# Patient Record
Sex: Male | Born: 1970 | Race: Black or African American | Hispanic: No | Smoking: Never smoker
Health system: Southern US, Community
[De-identification: ages and names within clinical notes are randomized; demographics above are authoritative.]

## PROBLEM LIST (undated history)

## (undated) DIAGNOSIS — I209 Angina pectoris, unspecified: Secondary | ICD-10-CM

## (undated) DIAGNOSIS — J45909 Unspecified asthma, uncomplicated: Secondary | ICD-10-CM

---

## 2001-06-25 ENCOUNTER — Emergency Department (HOSPITAL_COMMUNITY): Admission: EM | Admit: 2001-06-25 | Discharge: 2001-06-25 | Payer: Self-pay | Admitting: Emergency Medicine

## 2002-03-13 ENCOUNTER — Emergency Department (HOSPITAL_COMMUNITY): Admission: EM | Admit: 2002-03-13 | Discharge: 2002-03-14 | Payer: Self-pay | Admitting: Emergency Medicine

## 2002-06-27 ENCOUNTER — Encounter: Payer: Self-pay | Admitting: Emergency Medicine

## 2002-06-27 ENCOUNTER — Emergency Department (HOSPITAL_COMMUNITY): Admission: EM | Admit: 2002-06-27 | Discharge: 2002-06-27 | Payer: Self-pay | Admitting: Emergency Medicine

## 2002-09-27 ENCOUNTER — Emergency Department (HOSPITAL_COMMUNITY): Admission: EM | Admit: 2002-09-27 | Discharge: 2002-09-27 | Payer: Self-pay | Admitting: Emergency Medicine

## 2003-05-21 ENCOUNTER — Emergency Department (HOSPITAL_COMMUNITY): Admission: EM | Admit: 2003-05-21 | Discharge: 2003-05-21 | Payer: Self-pay | Admitting: Emergency Medicine

## 2003-05-27 ENCOUNTER — Emergency Department (HOSPITAL_COMMUNITY): Admission: EM | Admit: 2003-05-27 | Discharge: 2003-05-27 | Payer: Self-pay | Admitting: Family Medicine

## 2007-11-01 ENCOUNTER — Emergency Department (HOSPITAL_COMMUNITY): Admission: EM | Admit: 2007-11-01 | Discharge: 2007-11-01 | Payer: Self-pay | Admitting: Emergency Medicine

## 2007-11-18 ENCOUNTER — Emergency Department (HOSPITAL_COMMUNITY): Admission: EM | Admit: 2007-11-18 | Discharge: 2007-11-18 | Payer: Self-pay | Admitting: Emergency Medicine

## 2008-03-15 ENCOUNTER — Emergency Department (HOSPITAL_COMMUNITY): Admission: EM | Admit: 2008-03-15 | Discharge: 2008-03-15 | Payer: Self-pay | Admitting: Emergency Medicine

## 2008-07-05 ENCOUNTER — Emergency Department (HOSPITAL_COMMUNITY): Admission: EM | Admit: 2008-07-05 | Discharge: 2008-07-05 | Payer: Self-pay | Admitting: Emergency Medicine

## 2008-09-13 ENCOUNTER — Emergency Department (HOSPITAL_COMMUNITY): Admission: EM | Admit: 2008-09-13 | Discharge: 2008-09-13 | Payer: Self-pay | Admitting: Emergency Medicine

## 2009-01-17 ENCOUNTER — Emergency Department (HOSPITAL_COMMUNITY): Admission: EM | Admit: 2009-01-17 | Discharge: 2009-01-17 | Payer: Self-pay | Admitting: Emergency Medicine

## 2009-01-23 ENCOUNTER — Emergency Department (HOSPITAL_COMMUNITY): Admission: EM | Admit: 2009-01-23 | Discharge: 2009-01-23 | Payer: Self-pay | Admitting: Emergency Medicine

## 2009-02-28 ENCOUNTER — Emergency Department (HOSPITAL_COMMUNITY): Admission: EM | Admit: 2009-02-28 | Discharge: 2009-02-28 | Payer: Self-pay | Admitting: Family Medicine

## 2009-06-30 ENCOUNTER — Emergency Department (HOSPITAL_COMMUNITY): Admission: EM | Admit: 2009-06-30 | Discharge: 2009-06-30 | Payer: Self-pay | Admitting: Emergency Medicine

## 2009-07-17 ENCOUNTER — Emergency Department (HOSPITAL_COMMUNITY): Admission: EM | Admit: 2009-07-17 | Discharge: 2009-07-17 | Payer: Self-pay | Admitting: Emergency Medicine

## 2009-12-17 ENCOUNTER — Emergency Department (HOSPITAL_COMMUNITY): Admission: EM | Admit: 2009-12-17 | Discharge: 2009-12-17 | Payer: Self-pay | Admitting: Emergency Medicine

## 2010-05-11 LAB — CBC
MCH: 31.7 pg (ref 26.0–34.0)
MCHC: 35.9 g/dL (ref 30.0–36.0)
Platelets: 241 10*3/uL (ref 150–400)
RBC: 4.86 MIL/uL (ref 4.22–5.81)

## 2010-05-11 LAB — DIFFERENTIAL
Eosinophils Absolute: 0.1 10*3/uL (ref 0.0–0.7)
Eosinophils Relative: 1 % (ref 0–5)
Lymphocytes Relative: 45 % (ref 12–46)
Lymphs Abs: 2.2 10*3/uL (ref 0.7–4.0)
Monocytes Absolute: 0.5 10*3/uL (ref 0.1–1.0)
Monocytes Relative: 10 % (ref 3–12)

## 2010-05-11 LAB — COMPREHENSIVE METABOLIC PANEL
ALT: 13 U/L (ref 0–53)
AST: 21 U/L (ref 0–37)
Albumin: 4.3 g/dL (ref 3.5–5.2)
Calcium: 9.7 mg/dL (ref 8.4–10.5)
Creatinine, Ser: 1.16 mg/dL (ref 0.4–1.5)
GFR calc Af Amer: >60 mL/min (ref >60)
Sodium: 140 mEq/L (ref 135–145)

## 2010-05-17 LAB — POCT I-STAT, CHEM 8
BUN: 9 mg/dL (ref 6–23)
Calcium, Ion: 1.15 mmol/L (ref 1.12–1.32)
Chloride: 103 mEq/L (ref 96–112)
Creatinine, Ser: 1 mg/dL (ref 0.4–1.5)
Glucose, Bld: 84 mg/dL (ref 70–99)

## 2010-06-07 LAB — URINALYSIS, ROUTINE W REFLEX MICROSCOPIC
Bilirubin Urine: NEGATIVE
Glucose, UA: NEGATIVE mg/dL
Hgb urine dipstick: NEGATIVE
Ketones, ur: NEGATIVE mg/dL
pH: 7 (ref 5.0–8.0)

## 2010-08-07 ENCOUNTER — Emergency Department (HOSPITAL_COMMUNITY)
Admission: EM | Admit: 2010-08-07 | Discharge: 2010-08-07 | Disposition: A | Discharge status: Home / Self Care | Payer: Self-pay | Attending: Emergency Medicine | Admitting: Emergency Medicine

## 2010-08-07 DIAGNOSIS — J45909 Unspecified asthma, uncomplicated: Secondary | ICD-10-CM | POA: Insufficient documentation

## 2010-08-07 DIAGNOSIS — R350 Frequency of micturition: Secondary | ICD-10-CM | POA: Insufficient documentation

## 2010-08-07 DIAGNOSIS — R51 Headache: Secondary | ICD-10-CM | POA: Insufficient documentation

## 2010-08-07 LAB — GLUCOSE, CAPILLARY: Glucose-Capillary: 98 mg/dL (ref 70–99)

## 2010-08-07 LAB — URINALYSIS, ROUTINE W REFLEX MICROSCOPIC
Glucose, UA: NEGATIVE mg/dL
Leukocytes, UA: NEGATIVE
Protein, ur: NEGATIVE mg/dL
Specific Gravity, Urine: 1.021 (ref 1.005–1.030)

## 2010-09-03 ENCOUNTER — Emergency Department (HOSPITAL_COMMUNITY)
Admission: EM | Admit: 2010-09-03 | Discharge: 2010-09-03 | Disposition: A | Discharge status: Home / Self Care | Payer: Self-pay | Attending: Emergency Medicine | Admitting: Emergency Medicine

## 2010-09-03 ENCOUNTER — Emergency Department (HOSPITAL_COMMUNITY): Payer: Self-pay

## 2010-09-03 DIAGNOSIS — K219 Gastro-esophageal reflux disease without esophagitis: Secondary | ICD-10-CM | POA: Insufficient documentation

## 2010-09-03 DIAGNOSIS — R05 Cough: Secondary | ICD-10-CM | POA: Insufficient documentation

## 2010-09-03 DIAGNOSIS — R059 Cough, unspecified: Secondary | ICD-10-CM | POA: Insufficient documentation

## 2010-09-03 LAB — D-DIMER, QUANTITATIVE: D-Dimer, Quant: 0.24 ug/mL-FEU (ref 0.00–0.48)

## 2010-09-03 LAB — DIFFERENTIAL
Basophils Relative: 1 % (ref 0–1)
Eosinophils Absolute: 0.1 10*3/uL (ref 0.0–0.7)
Lymphs Abs: 3.2 10*3/uL (ref 0.7–4.0)
Neutrophils Relative %: 43 % (ref 43–77)

## 2010-09-03 LAB — POCT I-STAT, CHEM 8
Calcium, Ion: 1.26 mmol/L (ref 1.12–1.32)
Chloride: 102 mEq/L (ref 96–112)
HCT: 49 % (ref 39.0–52.0)
TCO2: 29 mmol/L (ref 0–100)

## 2010-09-03 LAB — CK TOTAL AND CKMB (NOT AT ARMC)
CK, MB: 1.4 ng/mL (ref 0.3–4.0)
Relative Index: 0.6 (ref 0.0–2.5)

## 2010-09-03 LAB — CBC
Platelets: 217 10*3/uL (ref 150–400)
RBC: 5.02 MIL/uL (ref 4.22–5.81)
WBC: 6.6 10*3/uL (ref 4.0–10.5)

## 2010-11-30 LAB — COMPREHENSIVE METABOLIC PANEL
ALT: 26
Albumin: 4.3
Alkaline Phosphatase: 47
BUN: 10
Chloride: 103
Potassium: 4.5
Sodium: 138
Total Bilirubin: 1

## 2010-11-30 LAB — URINE MICROSCOPIC-ADD ON

## 2010-11-30 LAB — CBC
HCT: 44
Hemoglobin: 15.2
Platelets: 203
WBC: 4.4

## 2010-11-30 LAB — DIFFERENTIAL
Basophils Absolute: 0
Basophils Relative: 0
Eosinophils Absolute: 0
Eosinophils Relative: 0
Monocytes Absolute: 0.3
Neutro Abs: 3.1

## 2010-11-30 LAB — POCT CARDIAC MARKERS: CKMB, poc: 1

## 2010-11-30 LAB — URINALYSIS, ROUTINE W REFLEX MICROSCOPIC
Bilirubin Urine: NEGATIVE
Protein, ur: NEGATIVE
Urobilinogen, UA: 1

## 2011-02-23 ENCOUNTER — Ambulatory Visit (INDEPENDENT_AMBULATORY_CARE_PROVIDER_SITE_OTHER): Payer: Self-pay | Admitting: Internal Medicine

## 2011-02-23 ENCOUNTER — Encounter: Payer: Self-pay | Admitting: Internal Medicine

## 2011-02-23 DIAGNOSIS — R079 Chest pain, unspecified: Secondary | ICD-10-CM | POA: Insufficient documentation

## 2011-02-23 NOTE — Progress Notes (Signed)
  Subjective:    Patient ID: Darren Mccoy, male    DOB: 1970-10-23, 40 y.o.   MRN: [id number]  HPI  82 yobm [place] in [place] since 2001 never smoker with chronic chest pain and sob self referred 02/23/2011  to pulmonary clinic.   02/23/2011 1st pulmonary eval cc daily cp x 2 years seems best first thing am positional worse on lying down L side with no assoc cough - has sense of sob but actually does better with activity than at rest.  Sleeping ok without nocturnal  or early am exacerbation  of respiratory  C/o's. Also denies any obvious fluctuation of symptoms with weather or environmental changes or other aggravating or alleviating factors except as outlined above   Review of Systems  Constitutional: Negative for fever and unexpected weight change.  HENT: Negative for ear pain, nosebleeds, congestion, sore throat, rhinorrhea, sneezing, trouble swallowing, dental problem, postnasal drip and sinus pressure.   Eyes: Negative for redness and itching.  Respiratory: Positive for chest tightness and shortness of breath. Negative for cough and wheezing.   Cardiovascular: Positive for chest pain. Negative for palpitations and leg swelling.  Gastrointestinal: Positive for nausea. Negative for vomiting.  Genitourinary: Negative for dysuria.  Musculoskeletal: Negative for joint swelling.  Skin: Negative for rash.  Neurological: Positive for headaches.  Hematological: Does not bruise/bleed easily.  Psychiatric/Behavioral: Negative for dysphoric mood. The patient is not nervous/anxious.        Objective:   Physical Exam amb wm nad Wt 148 02/23/2011 HEENT: nl dentition, turbinates, and orophanx. Nl external ear canals without cough reflex   NECK :  without JVD/Nodes/TM/ nl carotid upstrokes bilaterally   LUNGS: no acc muscle use, clear to A and P bilaterally without cough on insp or exp maneuvers   CV:  RRR  no s3 or murmur or increase in P2, no edema   ABD:  soft and nontender with nl  excursion in the supine position. No bruits or organomegaly, bowel sounds nl  MS:  warm without deformities, calf tenderness, cyanosis or clubbing  SKIN: warm and dry without lesions    NEURO:  alert, approp, no deficits    09/03/10 cxr No evidence of acute cardiopulmonary disease. There is significant air in splenic flex of colon        Assessment & Plan:

## 2011-02-23 NOTE — Patient Instructions (Addendum)
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 Frequently these patients have had multiple negative GI workups and CT scans.  Treatment consists of avoiding foods that cause gas (especially beans and raw vegetables like spinach and salads and boiled eggs)  and citrucel 1 heaping tsp twice daily with a large glass of water.  Pain should improve w/in 2 weeks and if not   Call  patient coordinator [phone])  To schedule ct of chest

## 2011-02-23 NOTE — Assessment & Plan Note (Signed)
Classic subdiaphragmatic pain pattern suggests ibs:  Stereotypical,  with a very limited distribution of pain locations, daytime, not exacerbated by ex (which he actually tolerates well) or coughing, worse in sitting position, associated with generalized abd bloating, not present supine due to the dome effect of the diaphragm is  canceled in that position. Frequently these patients have had multiple negative GI workups and CT scans.  Treatment consists of avoiding foods that cause gas (especially beans and raw vegetables like spinach and salads)  and citrucel 1 heaping tsp twice daily with a large glass of water.  Pain should improve w/in 2 weeks and if not then consider further w/u with ct chest

## 2011-04-19 ENCOUNTER — Encounter (HOSPITAL_COMMUNITY): Payer: Self-pay

## 2011-04-19 ENCOUNTER — Emergency Department (HOSPITAL_COMMUNITY)
Admission: EM | Admit: 2011-04-19 | Discharge: 2011-04-19 | Disposition: A | Discharge status: Home / Self Care | Payer: Self-pay | Attending: Emergency Medicine | Admitting: Emergency Medicine

## 2011-04-19 DIAGNOSIS — R51 Headache: Secondary | ICD-10-CM | POA: Insufficient documentation

## 2011-04-19 MED ORDER — METOCLOPRAMIDE HCL 5 MG/ML IJ SOLN
10.0000 mg | Freq: Once | INTRAMUSCULAR | Status: AC
  Administered 2011-04-19: 10 mg via INTRAMUSCULAR
  Filled 2011-04-19: qty 2

## 2011-04-19 MED ORDER — KETOROLAC TROMETHAMINE 30 MG/ML IJ SOLN
30.0000 mg | Freq: Once | INTRAMUSCULAR | Status: AC
  Administered 2011-04-19: 30 mg via INTRAMUSCULAR
  Filled 2011-04-19: qty 1

## 2011-04-19 MED ORDER — IBUPROFEN 600 MG PO TABS: 600.0000 mg | ORAL_TABLET | Freq: Four times a day (QID) | ORAL | Status: AC | PRN

## 2011-04-19 MED ORDER — ONDANSETRON HCL 4 MG PO TABS: 4.0000 mg | ORAL_TABLET | Freq: Four times a day (QID) | ORAL | Status: AC

## 2011-04-19 NOTE — ED Provider Notes (Signed)
History     CSN: [id number]  Arrival date & time 04/19/11  1219   First MD Initiated Contact with Patient 04/19/11 1430      Chief Complaint  Patient presents with  . Headache    (Consider location/radiation/quality/duration/timing/severity/associated sxs/prior treatment) HPI Patient presents with complaint of headache. He states that he has had intermittent headaches over the past several years. Headache is complaint as dull and frontal in nature. He states he has had a reassuring CT and MRI and workup for this headache. He was also seen by [facility]ic who advised that this may be a caffeine withdrawal-type of headache. He states that he has not had any relief from ibuprofen which he last took yesterday. He has no changes in his vision, no changes in speech, no weakness or numbness of his extremities. He denies fever or vomiting. There no other associated systemic symptoms. There are no alleviating or modifying factors.  History reviewed. No pertinent past medical history.  History reviewed. No pertinent past surgical history.  No family history on file.  History  Substance Use Topics  . Smoking status: Never Smoker   . Smokeless tobacco: Not on file  . Alcohol Use: No      Review of Systems ROS reviewed and otherwise negative except for mentioned in HPI  Allergies  Review of patient's allergies indicates no known allergies.  Home Medications   Current Outpatient Rx  Name Route Sig Dispense Refill  . IBUPROFEN 200 MG PO TABS Oral Take 400 mg by mouth at bedtime as needed. Pain.    . IBUPROFEN 600 MG PO TABS Oral Take 1 tablet (600 mg total) by mouth every 6 (six) hours as needed for pain. 30 tablet 0  . ONDANSETRON HCL 4 MG PO TABS Oral Take 1 tablet (4 mg total) by mouth every 6 (six) hours. 12 tablet 0    BP 108/78  Pulse 91  Temp(Src) 98.1 F (36.7 C) (Oral)  Resp 20  SpO2 100% Vitals reviewed Physical Exam Physical Examination: General appearance -  alert, well appearing, and in no distress Mental status - alert, oriented to person, place, and time Eyes - pupils equal and reactive, extraocular eye movements intact Mouth - mucous membranes moist, pharynx normal without lesions Chest - clear to auscultation, no wheezes, rales or rhonchi, symmetric air entry Heart - normal rate, regular rhythm, normal S1, S2, no murmurs, rubs, clicks or gallops Abdomen - soft, nontender, nondistended, no masses or organomegaly Neurological - alert, oriented, normal speech, cranial nerves 2-12 tested and intact, strength 5/5 in extremities x 4, sensation intact Musculoskeletal - no joint tenderness, deformity or swelling Extremities - peripheral pulses normal, no pedal edema, no clubbing or cyanosis Skin - normal coloration and turgor, no rashes  ED Course  Procedures (including critical care time)  Labs Reviewed - No data to display No results found.   1. Headache       MDM  Patient presenting with headache similar to his prior chronic headaches. He has a normal neurologic examination. He was given IM pain medications and I have discussed with him that he will need to arrange for repeat followup with neurology. He was discharged with strict return precautions and is agreeable with this plan.        Ethelda Chick, MD 04/21/11 [phone]

## 2011-04-19 NOTE — Discharge Instructions (Signed)
Return to the ED with any concerns including changes in your vision, changes in her speech, weakness of an arm or leg, vomiting, seizure activity, decreased level of alertness or lethargy, or any other alarming symptoms.

## 2011-04-19 NOTE — ED Notes (Signed)
Pt presents with headaches x 2 years.  Pt reports CT scan and MRI have been negative, reports pain begins to frontal area, and by the end of the day, his eyes are red.  Pt denies any vision change.  -photophobia or nausea.  Pt also reports L knee pain after falling yesterday.

## 2011-08-22 ENCOUNTER — Ambulatory Visit: Payer: Self-pay | Admitting: Family Medicine

## 2011-08-22 VITALS — BP 102/65 | HR 68 | Temp 98.6°F | Resp 16 | Ht 70.0 in | Wt 141.4 lb

## 2011-08-22 DIAGNOSIS — Z0289 Encounter for other administrative examinations: Secondary | ICD-10-CM

## 2011-08-22 NOTE — Progress Notes (Signed)
  Subjective:    Patient ID: Darren Mccoy, male    DOB: 10-Jun-1970, 41 y.o.   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  See forms,    Review of Systems  HENT: Negative for hearing loss.   Eyes: Negative for visual disturbance.  Neurological: Negative for dizziness, weakness and light-headedness.  also per form.     Objective:   Physical Exam  Constitutional: He is oriented to person, place, and time. He appears well-developed and well-nourished.  HENT:  Head: Normocephalic and atraumatic.  Eyes: EOM are normal. Pupils are equal, round, and reactive to light.  Neck: Normal range of motion. No JVD present. Carotid bruit is not present.  Cardiovascular: Normal rate, regular rhythm and normal heart sounds.   No murmur heard. Pulmonary/Chest: Effort normal and breath sounds normal. He has no rales.  Abdominal: Soft. Hernia confirmed negative in the right inguinal area and confirmed negative in the left inguinal area.  Musculoskeletal: Normal range of motion. He exhibits no edema and no tenderness.  Neurological: He is alert and oriented to person, place, and time.  Skin: Skin is warm and dry.  Psychiatric: He has a normal mood and affect.          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  Small blood on U/a - follow up with primary provider,  2 year card.

## 2011-08-27 ENCOUNTER — Emergency Department (HOSPITAL_COMMUNITY): Payer: Self-pay

## 2011-08-27 ENCOUNTER — Encounter (HOSPITAL_COMMUNITY): Payer: Self-pay | Admitting: Emergency Medicine

## 2011-08-27 ENCOUNTER — Observation Stay (HOSPITAL_COMMUNITY)
Admission: EM | Admit: 2011-08-27 | Discharge: 2011-08-27 | Disposition: A | Discharge status: Home / Self Care | Payer: Self-pay | Attending: Emergency Medicine | Admitting: Emergency Medicine

## 2011-08-27 ENCOUNTER — Observation Stay (HOSPITAL_COMMUNITY): Payer: Self-pay

## 2011-08-27 DIAGNOSIS — R0602 Shortness of breath: Secondary | ICD-10-CM | POA: Insufficient documentation

## 2011-08-27 DIAGNOSIS — J45909 Unspecified asthma, uncomplicated: Secondary | ICD-10-CM | POA: Insufficient documentation

## 2011-08-27 DIAGNOSIS — R079 Chest pain, unspecified: Principal | ICD-10-CM | POA: Insufficient documentation

## 2011-08-27 DIAGNOSIS — R300 Dysuria: Secondary | ICD-10-CM | POA: Insufficient documentation

## 2011-08-27 DIAGNOSIS — R350 Frequency of micturition: Secondary | ICD-10-CM | POA: Insufficient documentation

## 2011-08-27 HISTORY — DX: Unspecified asthma, uncomplicated: J45.909

## 2011-08-27 LAB — POCT I-STAT, CHEM 8
BUN: 11 mg/dL (ref 6–23)
Calcium, Ion: 1.24 mmol/L (ref 1.12–1.32)
Chloride: 101 mEq/L (ref 96–112)
Creatinine, Ser: 1.1 mg/dL (ref 0.50–1.35)
Glucose, Bld: 85 mg/dL (ref 70–99)
TCO2: 27 mmol/L (ref 0–100)

## 2011-08-27 LAB — CBC
HCT: 45.3 % (ref 39.0–52.0)
Hemoglobin: 16.1 g/dL (ref 13.0–17.0)
MCH: 31 pg (ref 26.0–34.0)
MCHC: 35.5 g/dL (ref 30.0–36.0)
MCV: 87.3 fL (ref 78.0–100.0)
RBC: 5.19 MIL/uL (ref 4.22–5.81)

## 2011-08-27 LAB — URINALYSIS, ROUTINE W REFLEX MICROSCOPIC
Bilirubin Urine: NEGATIVE
Glucose, UA: NEGATIVE mg/dL
Hgb urine dipstick: NEGATIVE
Ketones, ur: NEGATIVE mg/dL
Leukocytes, UA: NEGATIVE
Protein, ur: NEGATIVE mg/dL
pH: 6.5 (ref 5.0–8.0)

## 2011-08-27 LAB — POCT I-STAT TROPONIN I: Troponin i, poc: 0 ng/mL (ref 0.00–0.08)

## 2011-08-27 MED ORDER — ASPIRIN 81 MG PO CHEW
324.0000 mg | CHEWABLE_TABLET | Freq: Once | ORAL | Status: AC
  Administered 2011-08-27: 324 mg via ORAL
  Filled 2011-08-27: qty 4

## 2011-08-27 MED ORDER — METOPROLOL TARTRATE 25 MG PO TABS
100.0000 mg | ORAL_TABLET | Freq: Once | ORAL | Status: AC
  Administered 2011-08-27: 100 mg via ORAL

## 2011-08-27 MED ORDER — METOPROLOL TARTRATE 25 MG PO TABS
25.0000 mg | ORAL_TABLET | Freq: Once | ORAL | Status: DC
  Filled 2011-08-27: qty 4

## 2011-08-27 MED ORDER — OXYCODONE-ACETAMINOPHEN 5-325 MG PO TABS
1.0000 | ORAL_TABLET | Freq: Once | ORAL | Status: AC
  Administered 2011-08-27: 1 via ORAL
  Filled 2011-08-27: qty 1

## 2011-08-27 MED ORDER — METOPROLOL TARTRATE 25 MG PO TABS: 50.0000 mg | ORAL_TABLET | Freq: Once | ORAL | Status: DC

## 2011-08-27 MED ORDER — NITROGLYCERIN 0.4 MG SL SUBL
0.4000 mg | SUBLINGUAL_TABLET | Freq: Once | SUBLINGUAL | Status: AC
  Administered 2011-08-27: 0.4 mg via SUBLINGUAL
  Filled 2011-08-27: qty 25

## 2011-08-27 MED ORDER — METOPROLOL TARTRATE 1 MG/ML IV SOLN
5.0000 mg | Freq: Once | INTRAVENOUS | Status: DC
  Filled 2011-08-27: qty 10

## 2011-08-27 MED ORDER — METOPROLOL TARTRATE 25 MG PO TABS
50.0000 mg | ORAL_TABLET | Freq: Once | ORAL | Status: AC
  Administered 2011-08-27: 50 mg via ORAL
  Filled 2011-08-27: qty 2

## 2011-08-27 MED ORDER — IOHEXOL 350 MG/ML SOLN
80.0000 mL | Freq: Once | INTRAVENOUS | Status: AC | PRN
  Administered 2011-08-27: 80 mL via INTRAVENOUS

## 2011-08-27 NOTE — ED Notes (Signed)
Pt returned from x-ray, also aware of the need for UA sample.

## 2011-08-27 NOTE — ED Notes (Signed)
Pt placed on 5 lead cardiac monitor, cont. Pulse ox and cont. bp.

## 2011-08-27 NOTE — Discharge Instructions (Signed)
As we discussed, the CT scan today shows your pain is not caused by heart diease. The CT scan showed you may have something called a Patent Foramen Ovale, for which you can have further testing by a cardiologist. The number to contact them is listed above.    Chest Pain (Nonspecific) It is often hard to give a specific diagnosis for the cause of chest pain. There is always a chance that your pain could be related to something serious, such as a heart attack or a blood clot in the lungs. You need to follow up with your caregiver for further evaluation. CAUSES   Heartburn.   Pneumonia or bronchitis.   Anxiety or stress.   Inflammation around your heart (pericarditis) or lung (pleuritis or pleurisy).   A blood clot in the lung.   A collapsed lung (pneumothorax). It can develop suddenly on its own (spontaneous pneumothorax) or from injury (trauma) to the chest.   Shingles infection (herpes zoster virus).  The chest wall is composed of bones, muscles, and cartilage. Any of these can be the source of the pain.  The bones can be bruised by injury.   The muscles or cartilage can be strained by coughing or overwork.   The cartilage can be affected by inflammation and become sore (costochondritis).  DIAGNOSIS  Lab tests or other studies, such as X-rays, electrocardiography, stress testing, or cardiac imaging, may be needed to find the cause of your pain.  TREATMENT   Treatment depends on what may be causing your chest pain. Treatment may include:   Acid blockers for heartburn.   Anti-inflammatory medicine.   Pain medicine for inflammatory conditions.   Antibiotics if an infection is present.   You may be advised to change lifestyle habits. This includes stopping smoking and avoiding alcohol, caffeine, and chocolate.   You may be advised to keep your head raised (elevated) when sleeping. This reduces the chance of acid going backward from your stomach into your esophagus.   Most of  the time, nonspecific chest pain will improve within 2 to 3 days with rest and mild pain medicine.  HOME CARE INSTRUCTIONS   If antibiotics were prescribed, take your antibiotics as directed. Finish them even if you start to feel better.   For the next few days, avoid physical activities that bring on chest pain. Continue physical activities as directed.   Do not smoke.   Avoid drinking alcohol.   Only take over-the-counter or prescription medicine for pain, discomfort, or fever as directed by your caregiver.   Follow your caregiver's suggestions for further testing if your chest pain does not go away.   Keep any follow-up appointments you made. If you do not go to an appointment, you could develop lasting (chronic) problems with pain. If there is any problem keeping an appointment, you must call to reschedule.  SEEK MEDICAL CARE IF:   You think you are having problems from the medicine you are taking. Read your medicine instructions carefully.   Your chest pain does not go away, even after treatment.   You develop a rash with blisters on your chest.  SEEK IMMEDIATE MEDICAL CARE IF:   You have increased chest pain or pain that spreads to your arm, neck, jaw, back, or abdomen.   You develop shortness of breath, an increasing cough, or you are coughing up blood.   You have severe back or abdominal pain, feel nauseous, or vomit.   You develop severe weakness, fainting, or chills.  You have a fever.  THIS IS AN EMERGENCY. Do not wait to see if the pain will go away. Get medical help at once. Call your local emergency services (911 in [place] Do not drive yourself to the hospital. MAKE SURE YOU:   Understand these instructions.   Will watch your condition.   Will get help right away if you are not doing well or get worse.  Document Released: 11/23/2004 Document Revised: 02/02/2011 Document Reviewed: 09/19/2007 [facility] Patient Information 2012 [place], [place].

## 2011-08-27 NOTE — ED Notes (Signed)
Pt reports left sided chest pain intermittently x 1 week. Pt reports shortness of breath with pain and that his eyes and palms of hands get red.

## 2011-08-27 NOTE — ED Provider Notes (Signed)
12:09 PM Pt is in the CDU on chest pain protocol. Initial care by PA Paz. Fairly healthy 41 y/o [place] male with only coronary disease in his mother late in life. Intermittent pain for the last 2 years with EKGs but no advanced testing performed in the past. Pain free at this time.  On exam, he is alert and oriented, NAD. Lungs CTAB. Heart RRR. Bilateral radial and DP pulses are 2+.  Plan is for coronary CT as soon as his heart rate is within acceptable limits.    3:58 PM Coronary CT results are discussed with radiologist. A shunt has no evidence of significant coronary disease with a calcium score of 0. Question the presence of a patent foramen ovale. This is discussed with the patient and he is given a handout with information on this possible diagnosis. I discussed with him followup with a cardiologist for further testing it was his understanding of the plan. He will be discharged home at this time.  Shaaron Adler, [place] 08/27/11 1559

## 2011-08-27 NOTE — ED Provider Notes (Signed)
And in complaining of intermittent chest pain for the last one week. He states he's had these episodes intermittently that occur every 3-4 months for the last 2 years. They are no associated symptoms other than some mild shortness of breath with these. He denies any nausea vomiting or diaphoresis. Patient has no cardiac history and takes no pain medications or cardiac medications. He is a [employer] but denies any leg pain or swelling.  Gen: NAD HEENT:  Normal, atraumatic, moist mucous membranes Resp: CTAB, no chest wall pain CV: RRR, no MRG.  Normal peripheral pulses Abd: nontender and no masses Skin: no erythema or rashes  Pt will be placed in CDU cardiac obs for low risk chest pain and undergo cardiac CT  Gwyneth Sprout, MD 08/27/11 1538

## 2011-08-27 NOTE — ED Provider Notes (Signed)
History     CSN: [id number]  Arrival date & time 08/27/11  5621   First MD Initiated Contact with Patient 08/27/11 [phone]      Chief Complaint  Patient presents with  . Chest Pain    (Consider location/radiation/quality/duration/timing/severity/associated sxs/prior treatment) HPI Comments: Patient with a history of asthma presents emergency department with chief complaint of intermittent chest pain.  Patient reports that he's been having this chest pain for about 2 years and denies ever having a cardiac evaluation.  The chest pain occurs every 2-3 months and lasts approximately 2 weeks coming and going throughout the day.  Patient states that when the chest pain hits it can last up to 6-7 hours and is described as a left-sided pressure is associated with shortness of breath and relieves on its own.  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  Patient denies fever, night sweats, chills, cough, hemoptysis, orthopnea, PND, dyspnea on exertion, leg swelling, palpitations, lightheadedness, abdominal pain.  In addition patient states that he has had dysuria and urinary frequency times one week.  Patient had a physical exam last week that was positive for hematuria.  Patient denies abnormal discharge or back pain.  Patient did not have a history of diabetes, hyperlipidemia, hypertension, has never smoked cigarettes, and did not have a family history of cardiac disease.  Patient is a 41 y.o. male presenting with chest pain. The history is provided by the patient.  Chest Pain Primary symptoms include shortness of breath. Pertinent negatives for primary symptoms include no fever, no cough, no wheezing, no palpitations, no abdominal pain and no dizziness.  Pertinent negatives for associated symptoms include no numbness and no weakness.     Past Medical History  Diagnosis Date  . Asthma     History reviewed. No pertinent past surgical  history.  History reviewed. No pertinent family history.  History  Substance Use Topics  . Smoking status: Never Smoker   . Smokeless tobacco: Not on file  . Alcohol Use: No      Review of Systems  Constitutional: Negative for fever, chills and appetite change.  HENT: Negative for congestion.   Eyes: Negative for visual disturbance.  Respiratory: Positive for shortness of breath. Negative for apnea, cough, choking, chest tightness, wheezing and stridor.   Cardiovascular: Positive for chest pain. Negative for palpitations and leg swelling.  Gastrointestinal: Negative for abdominal pain.  Genitourinary: Positive for dysuria and frequency. Negative for urgency.  Neurological: Negative for dizziness, syncope, weakness, light-headedness, numbness and headaches.  Psychiatric/Behavioral: Negative for confusion.  All other systems reviewed and are negative.    Allergies  Review of patient's allergies indicates no known allergies.  Home Medications   Current Outpatient Rx  Name Route Sig Dispense Refill  . DIPHENHYDRAMINE-APAP (SLEEP) 50-1000 MG/30ML PO LIQD Oral Take 5 mLs by mouth at bedtime as needed. For pain/sleep      BP 124/82  Pulse 64  Temp 97.8 F (36.6 C) (Oral)  Resp 16  SpO2 100%  Physical Exam  Nursing note and vitals reviewed. Constitutional: He appears well-developed and well-nourished. No distress.  HENT:  Head: Normocephalic and atraumatic.  Eyes: Conjunctivae and EOM are normal. Pupils are equal, round, and reactive to light.  Neck: Normal range of motion. Neck supple. Normal carotid pulses and no JVD present. Carotid bruit is not present. No rigidity. Normal range of motion present.  Cardiovascular: Normal rate, regular rhythm, S1 normal, S2  normal, normal heart sounds, intact distal pulses and normal pulses.  Exam reveals no gallop and no friction rub.   No murmur heard.      No pitting edema bilaterally, RRR, no aberrant sounds on auscultations,  distal pulses intact, no carotid bruit or JVD.   Pulmonary/Chest: Effort normal. No accessory muscle usage or stridor. He exhibits no tenderness and no bony tenderness.       LCAB, no resp distress  Abdominal: Bowel sounds are normal.       Soft non tender. Non pulsatile aorta.   Skin: Skin is warm, dry and intact. No rash noted. He is not diaphoretic. No cyanosis. Nails show no clubbing.    ED Course  Procedures (including critical care time)   Labs Reviewed  CBC  POCT I-STAT, CHEM 8  POCT I-STAT TROPONIN I  URINALYSIS, ROUTINE W REFLEX MICROSCOPIC   Dg Chest 2 View  08/27/2011  *RADIOLOGY REPORT*  Clinical Data: Chest pain  CHEST - 2 VIEW  Comparison: 09/03/2010  Findings: Heart size is normal.  No pleural effusion or edema.  No airspace consolidation.  Review of the visualized osseous structures is unremarkable.  IMPRESSION:  1.  No acute cardiopulmonary abnormalities  Original Report Authenticated By: Rosealee Albee, M.D.     No diagnosis found.   Date: 08/27/2011  Rate: 66  Rhythm: normal sinus rhythm  QRS Axis: normal  Intervals: normal  ST/T Wave abnormalities: Nonspecific ST and T wave changes  Conduction Disutrbances: none  Narrative Interpretation: Possible left atrial enlargement and LVH  Old EKG Reviewed: No significant changes noted  Dr. Olevia Bowens requests 100 mg metoprolol ASAP. Re-confirmed pts HR is only 64 and he again requests this dose. Dose ordered. Discussed with Lum Babe in CDU who agrees to observe pt for up to 4 hrs to assure his HR normalizes prior to dc.   MDM  Chest pain  Patient moved to CDU under chest pain protocol. Patient resting comfortably at present without return of chest pain. Lungs CTA bilaterally. S1/S2, RRR, no murmur. Abdomen soft, bowel sounds present. Strong distal pulses palpated all extremities. Sinus rhythm on monitor without ectopy. Troponin negative x 1, 12 lead reviewed, no indication of ischemia. Patient scheduled for  coronary CT today.  Diagnostic and treatment plan discussed with patient. Dispo pending results          Jaci Carrel, PA-C 08/27/11 1117

## 2011-08-28 NOTE — ED Provider Notes (Signed)
Medical screening examination/treatment/procedure(s) were conducted as a shared visit with non-physician practitioner(s) and myself.  I personally evaluated the patient during the encounter   Darren Sprout, MD 08/28/11 1636

## 2011-08-28 NOTE — ED Provider Notes (Signed)
Medical screening examination/treatment/procedure(s) were conducted as a shared visit with non-physician practitioner(s) and myself.  I personally evaluated the patient during the encounter   Gwyneth Sprout, MD 08/28/11 1637

## 2011-09-26 ENCOUNTER — Encounter: Payer: Self-pay | Admitting: *Deleted

## 2011-09-26 ENCOUNTER — Telehealth: Payer: Self-pay | Admitting: Internal Medicine

## 2011-09-26 NOTE — Telephone Encounter (Signed)
ATC patient at number provided.  No answer, lmtcb.

## 2011-09-27 NOTE — Telephone Encounter (Signed)
[email or web address]  

## 2011-09-28 ENCOUNTER — Ambulatory Visit: Payer: Self-pay | Admitting: Cardiology

## 2011-09-28 NOTE — Telephone Encounter (Signed)
[id number] on home and cell number. Naethan Bracewell, CMA  

## 2011-09-29 NOTE — Telephone Encounter (Signed)
Results?  Pt hasn't been seen by MW since 01/2011.  Per triage protocol, will close message and await pt to call back.  Left this on the voice mail.

## 2011-10-26 ENCOUNTER — Ambulatory Visit: Payer: Self-pay | Admitting: Cardiology

## 2011-12-16 ENCOUNTER — Emergency Department (HOSPITAL_COMMUNITY): Payer: Self-pay

## 2011-12-16 ENCOUNTER — Encounter (HOSPITAL_COMMUNITY): Payer: Self-pay | Admitting: Emergency Medicine

## 2011-12-16 ENCOUNTER — Other Ambulatory Visit: Payer: Self-pay

## 2011-12-16 ENCOUNTER — Emergency Department (HOSPITAL_COMMUNITY)
Admission: EM | Admit: 2011-12-16 | Discharge: 2011-12-16 | Disposition: A | Discharge status: Home / Self Care | Payer: Self-pay | Attending: Emergency Medicine | Admitting: Emergency Medicine

## 2011-12-16 DIAGNOSIS — J45909 Unspecified asthma, uncomplicated: Secondary | ICD-10-CM | POA: Insufficient documentation

## 2011-12-16 DIAGNOSIS — R079 Chest pain, unspecified: Secondary | ICD-10-CM

## 2011-12-16 LAB — CBC
HCT: 43.7 % (ref 39.0–52.0)
Hemoglobin: 15.6 g/dL (ref 13.0–17.0)
MCH: 31.6 pg (ref 26.0–34.0)
MCHC: 35.7 g/dL (ref 30.0–36.0)

## 2011-12-16 LAB — BASIC METABOLIC PANEL
BUN: 16 mg/dL (ref 6–23)
Calcium: 10 mg/dL (ref 8.4–10.5)
GFR calc non Af Amer: 80 mL/min — ABNORMAL LOW (ref >90)
Glucose, Bld: 96 mg/dL (ref 70–99)
Potassium: 4.5 mEq/L (ref 3.5–5.1)

## 2011-12-16 LAB — URINALYSIS, ROUTINE W REFLEX MICROSCOPIC
Bilirubin Urine: NEGATIVE
Glucose, UA: NEGATIVE mg/dL
Hgb urine dipstick: NEGATIVE
Specific Gravity, Urine: 1.009 (ref 1.005–1.030)
Urobilinogen, UA: 0.2 mg/dL (ref 0.0–1.0)

## 2011-12-16 LAB — POCT I-STAT TROPONIN I

## 2011-12-16 MED ORDER — IBUPROFEN 200 MG PO TABS
600.0000 mg | ORAL_TABLET | Freq: Once | ORAL | Status: AC
  Administered 2011-12-16: 600 mg via ORAL
  Filled 2011-12-16: qty 3

## 2011-12-16 NOTE — ED Notes (Addendum)
Pt c/o left sided CP with SOB x 1 week worse with inspiration; pt sts some pain with urination as well

## 2011-12-16 NOTE — ED Provider Notes (Addendum)
History     CSN: [id number]  Arrival date & time 12/16/11  4098   First MD Initiated Contact with Patient 12/16/11 1117      Chief Complaint  Patient presents with  . Chest Pain    (Consider location/radiation/quality/duration/timing/severity/associated sxs/prior treatment) Patient is a 41 y.o. male presenting with chest pain. The history is provided by the patient.  Chest Pain Pertinent negatives for primary symptoms include no fever, no shortness of breath, no cough, no palpitations, no abdominal pain and no vomiting.   pt c/o left lateral cp for past 2-3 days. Constant. Dull. No radiation. No associated nv, diaphoresis or sob. Pt denies any specific or consistent exacerbating or alleviating factors. No chest wall injury or strain. No personal or family hx cad. No pleuritic pain. No leg pain or swelling. No immobility, trauma or surgery. No cough or uri c/o. No fever or chills.     Past Medical History  Diagnosis Date  . Asthma   . Chest pain     Evaluated  in Pulmonary clinic/ [facility]/ [facility] / 02/23/2011      History reviewed. No pertinent past surgical history.  History reviewed. No pertinent family history.  History  Substance Use Topics  . Smoking status: Never Smoker   . Smokeless tobacco: Not on file  . Alcohol Use: No      Review of Systems  Constitutional: Negative for fever.  HENT: Negative for neck pain.   Eyes: Negative for redness.  Respiratory: Negative for cough and shortness of breath.   Cardiovascular: Positive for chest pain. Negative for palpitations and leg swelling.  Gastrointestinal: Negative for vomiting and abdominal pain.  Genitourinary: Negative for flank pain.  Musculoskeletal: Negative for back pain.  Skin: Negative for rash.  Neurological: Negative for headaches.  Hematological: Does not bruise/bleed easily.  Psychiatric/Behavioral: Negative for confusion.    Allergies  Review of patient's allergies indicates no known  allergies.  Home Medications   Current Outpatient Rx  Name Route Sig Dispense Refill  . DIPHENHYDRAMINE-APAP (SLEEP) 50-1000 MG/30ML PO LIQD Oral Take 5 mLs by mouth at bedtime as needed. For pain/sleep      BP 121/75  Pulse 70  Temp 97.6 F (36.4 C) (Oral)  Resp 20  SpO2 97%  Physical Exam  Nursing note and vitals reviewed. Constitutional: He is oriented to person, place, and time. He appears well-developed and well-nourished. No distress.  HENT:  Head: Atraumatic.  Eyes: Conjunctivae normal are normal.  Neck: Neck supple. No tracheal deviation present.  Cardiovascular: Normal rate, regular rhythm, normal heart sounds and intact distal pulses.  Exam reveals no gallop and no friction rub.   No murmur heard. Pulmonary/Chest: Effort normal and breath sounds normal. No accessory muscle usage. No respiratory distress. He exhibits tenderness.  Abdominal: Soft. He exhibits no distension. There is no tenderness.  Musculoskeletal: Normal range of motion. He exhibits no edema and no tenderness.  Neurological: He is alert and oriented to person, place, and time.  Skin: Skin is warm and dry.  Psychiatric: He has a normal mood and affect.    ED Course  Procedures (including critical care time)  Results for orders placed during the hospital encounter of 12/16/11  CBC      Component Value Range   WBC 4.7  4.0 - 10.5 K/uL   RBC 4.94  4.22 - 5.81 MIL/uL   Hemoglobin 15.6  13.0 - 17.0 g/dL   HCT 11.9  14.7 - 82.9 %   MCV  88.5  78.0 - 100.0 fL   MCH 31.6  26.0 - 34.0 pg   MCHC 35.7  30.0 - 36.0 g/dL   RDW 16.1  09.6 - 04.5 %   Platelets 238  150 - 400 K/uL  BASIC METABOLIC PANEL      Component Value Range   Sodium 141  135 - 145 mEq/L   Potassium 4.5  3.5 - 5.1 mEq/L   Chloride 101  96 - 112 mEq/L   CO2 32  19 - 32 mEq/L   Glucose, Bld 96  70 - 99 mg/dL   BUN 16  6 - 23 mg/dL   Creatinine, Ser 4.09  0.50 - 1.35 mg/dL   Calcium 81.1  8.4 - 91.4 mg/dL   GFR calc non Af Amer 80  (*) >90 mL/min   GFR calc Af Amer >90  >90 mL/min  POCT I-STAT TROPONIN I      Component Value Range   Troponin i, poc 0.00  0.00 - 0.08 ng/mL   Comment 3            Dg Chest 2 View  12/16/2011  *RADIOLOGY REPORT*  Clinical Data: Chest pain  CHEST - 2 VIEW  Comparison: 08/27/2011  Findings: Cardiomediastinal silhouette is stable.  No acute infiltrate or pleural effusion.  No pulmonary edema.  Bony thorax is unremarkable.  IMPRESSION: No active disease.   Original Report Authenticated By: Natasha Mead, M.D.         MDM  Pt c/o left lateral cp x 2-3 days, constant.   After constant symptoms x 2-3 days, troponin 0. Prior workup for same including a few months ago, coronary ct neg, ca score 0.     Chest wall tenderness, mild. Motrin po.  No recent febrile/viral illness. No change in pain whether upright or supine, not pleuritic.   Reviewed nursing notes and prior charts for additional history.    Date: 12/16/2011  Rate: 67  Rhythm: normal sinus rhythm  QRS Axis: normal  Intervals: normal  ST/T Wave abnormalities: nonspecific ST changes  Conduction Disutrbances:none  Narrative Interpretation:   Old EKG Reviewed: unchanged Appearance of ecg unchanged from prior/old ecg.          Suzi Roots, MD 12/16/11 1123  Suzi Roots, MD 12/16/11 1124  Suzi Roots, MD 12/16/11 [phone]

## 2012-04-13 ENCOUNTER — Other Ambulatory Visit: Payer: Self-pay

## 2012-04-26 ENCOUNTER — Encounter (HOSPITAL_COMMUNITY): Payer: Self-pay

## 2012-04-26 ENCOUNTER — Emergency Department (HOSPITAL_COMMUNITY): Payer: Self-pay

## 2012-04-26 ENCOUNTER — Emergency Department (HOSPITAL_COMMUNITY)
Admission: EM | Admit: 2012-04-26 | Discharge: 2012-04-26 | Disposition: A | Discharge status: Home / Self Care | Payer: Self-pay | Attending: Emergency Medicine | Admitting: Emergency Medicine

## 2012-04-26 DIAGNOSIS — J45901 Unspecified asthma with (acute) exacerbation: Secondary | ICD-10-CM | POA: Insufficient documentation

## 2012-04-26 DIAGNOSIS — R079 Chest pain, unspecified: Secondary | ICD-10-CM | POA: Insufficient documentation

## 2012-04-26 LAB — BASIC METABOLIC PANEL
BUN: 15 mg/dL (ref 6–23)
CO2: 30 mEq/L (ref 19–32)
Chloride: 102 mEq/L (ref 96–112)
GFR calc non Af Amer: 75 mL/min — ABNORMAL LOW (ref >90)
Glucose, Bld: 81 mg/dL (ref 70–99)
Potassium: 4.3 mEq/L (ref 3.5–5.1)

## 2012-04-26 LAB — CBC
HCT: 45 % (ref 39.0–52.0)
Hemoglobin: 16.3 g/dL (ref 13.0–17.0)
MCHC: 36.2 g/dL — ABNORMAL HIGH (ref 30.0–36.0)
RBC: 5.1 MIL/uL (ref 4.22–5.81)

## 2012-04-26 MED ORDER — AZITHROMYCIN 250 MG PO TABS: 500.0000 mg | ORAL_TABLET | Freq: Once | ORAL | Status: DC

## 2012-04-26 MED ORDER — KETOROLAC TROMETHAMINE 30 MG/ML IJ SOLN
15.0000 mg | Freq: Once | INTRAMUSCULAR | Status: AC
  Administered 2012-04-26: 15 mg via INTRAVENOUS
  Filled 2012-04-26: qty 1

## 2012-04-26 MED ORDER — AZITHROMYCIN 250 MG PO TABS
500.0000 mg | ORAL_TABLET | Freq: Once | ORAL | Status: AC
  Administered 2012-04-26: 500 mg via ORAL
  Filled 2012-04-26: qty 3

## 2012-04-26 MED ORDER — AZITHROMYCIN 250 MG PO TABS: 250.0000 mg | ORAL_TABLET | Freq: Every day | ORAL | Status: AC

## 2012-04-26 MED ORDER — IOHEXOL 350 MG/ML SOLN
100.0000 mL | Freq: Once | INTRAVENOUS | Status: AC | PRN
  Administered 2012-04-26: 60 mL via INTRAVENOUS

## 2012-04-26 NOTE — ED Provider Notes (Signed)
History     CSN: [id number]  Arrival date & time 04/26/12  0955   First MD Initiated Contact with Patient 04/26/12 1113      Chief Complaint  Patient presents with  . Chest Pain  . Shortness of Breath    (Consider location/radiation/quality/duration/timing/severity/associated sxs/prior treatment) HPI The patient presents with 3 days of left sided chest pain.  The pain is focally about the left upper chest, nonradiating, nonpleuritic, nonexertional.  It is sharp.  He states that this pain is similar pain is experienced on and off for years.  The patient was evaluated here several times within the past year.  He had a CT scan 8 months ago. He states that this pain is not relieved with anything.  He states that prior episodes have been relieved with antibiotics. Denies current cough, dyspnea, fever, chills. The patient is a Marine scientist.  He returned yesterday from a ten-day trip.   Note, the patient has no significant medical problems and asthma.  He denies valve repair, which is mentioned in the triage note.   Past Medical History  Diagnosis Date  . Asthma   . Chest pain     Evaluated  in Pulmonary clinic/ [facility]/ [place] / 02/23/2011      History reviewed. No pertinent past surgical history.  History reviewed. No pertinent family history.  History  Substance Use Topics  . Smoking status: Never Smoker   . Smokeless tobacco: Not on file  . Alcohol Use: No      Review of Systems  Constitutional:       Per HPI, otherwise negative  HENT:       Per HPI, otherwise negative  Respiratory:       Per HPI, otherwise negative  Cardiovascular:       Per HPI, otherwise negative  Gastrointestinal: Negative for vomiting.  Endocrine:       Negative aside from HPI  Genitourinary:       Neg aside from HPI   Musculoskeletal:       Per HPI, otherwise negative  Skin: Negative.   Neurological: Negative for syncope.    Allergies  Review of patient's  allergies indicates no known allergies.  Home Medications   Current Outpatient Rx  Name  Route  Sig  Dispense  Refill  . Multiple Vitamin (MULTIVITAMIN WITH MINERALS) TABS   Oral   Take 1 tablet by mouth daily.         . Pseudoeph-Doxylamine-DM-APAP (NYQUIL PO)   Oral   Take 15 mLs by mouth at bedtime as needed (sleep).           BP 119/79  Pulse 76  Temp(Src) 97.7 F (36.5 C) (Oral)  Resp 15  Ht 6\' 5"  (1.956 m)  Wt 170 lb (77.111 kg)  BMI 20.15 kg/m2  SpO2 100%  Physical Exam  Nursing note and vitals reviewed. Constitutional: He is oriented to person, place, and time. He appears well-developed. No distress.  HENT:  Head: Normocephalic and atraumatic.  Eyes: Conjunctivae and EOM are normal.  Cardiovascular: Normal rate and regular rhythm.   Pulmonary/Chest: Effort normal. No stridor. No respiratory distress.  Abdominal: He exhibits no distension.  Musculoskeletal: He exhibits no edema.  Neurological: He is alert and oriented to person, place, and time.  Skin: Skin is warm and dry.  Psychiatric: He has a normal mood and affect.    ED Course  Procedures (including critical care time)  Labs Reviewed  CBC - Abnormal; Notable  for the following:    MCHC 36.2 (*)    All other components within normal limits  BASIC METABOLIC PANEL - Abnormal; Notable for the following:    GFR calc non Af Amer 75 (*)    GFR calc Af Amer 87 (*)    All other components within normal limits  D-DIMER, QUANTITATIVE   No results found.   No diagnosis found.  Pulse ox 99% room air normal Cardiac 80 sinus rhythm normal    Date: 04/26/2012  Rate: 76  Rhythm: normal sinus rhythm  QRS Axis: normal  Intervals: normal  ST/T Wave abnormalities: nonspecific ST/T changes  Conduction Disutrbances:none  Narrative Interpretation:   Old EKG Reviewed: unchanged ABNORMAL  A review of the patient's chart demonstrates that he has had evaluations for similar pain prior to today.  CT  coronaries 6 months ago was normal, with no evidence of disease.  3:37 PM On repeat exam the patient appears calm.  He was informed of all results, and the need for a primary care physician for followup. 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  The patient has no other notable risk factors, given his frequent immobilization for his occupation, there is some suspicion of thromboembolic disease.  The patient's initial evaluation was notable for elevated d-dimer, and subsequent CT scan did not demonstrate PE.  The patient had CT angiography 6 months ago that was also unremarkable, given these findings, there is little suspicion for either acute coronary or ulna Neri etiology of his pain.  Discussed this, the primary care followup at length.  The patient requests antibiotics, and this was accommodated.       Gerhard Munch, MD 04/26/12 1538

## 2012-04-26 NOTE — ED Notes (Signed)
Pt returned from CT °

## 2012-04-26 NOTE — ED Notes (Signed)
Results of I-stat troponin:    0.00 

## 2012-04-27 ENCOUNTER — Telehealth (HOSPITAL_COMMUNITY): Payer: Self-pay | Admitting: Emergency Medicine

## 2012-08-25 ENCOUNTER — Emergency Department (HOSPITAL_COMMUNITY)
Admission: EM | Admit: 2012-08-25 | Discharge: 2012-08-25 | Disposition: A | Discharge status: Home / Self Care | Payer: Self-pay | Attending: Emergency Medicine | Admitting: Emergency Medicine

## 2012-08-25 ENCOUNTER — Encounter (HOSPITAL_COMMUNITY): Payer: Self-pay | Admitting: Emergency Medicine

## 2012-08-25 DIAGNOSIS — R0789 Other chest pain: Secondary | ICD-10-CM | POA: Insufficient documentation

## 2012-08-25 DIAGNOSIS — Z760 Encounter for issue of repeat prescription: Secondary | ICD-10-CM | POA: Insufficient documentation

## 2012-08-25 DIAGNOSIS — Z79899 Other long term (current) drug therapy: Secondary | ICD-10-CM | POA: Insufficient documentation

## 2012-08-25 DIAGNOSIS — Z8709 Personal history of other diseases of the respiratory system: Secondary | ICD-10-CM | POA: Insufficient documentation

## 2012-08-25 DIAGNOSIS — R300 Dysuria: Secondary | ICD-10-CM | POA: Insufficient documentation

## 2012-08-25 DIAGNOSIS — J45909 Unspecified asthma, uncomplicated: Secondary | ICD-10-CM | POA: Insufficient documentation

## 2012-08-25 LAB — URINALYSIS, ROUTINE W REFLEX MICROSCOPIC
Bilirubin Urine: NEGATIVE
Nitrite: NEGATIVE
Specific Gravity, Urine: 1.023 (ref 1.005–1.030)
pH: 5.5 (ref 5.0–8.0)

## 2012-08-25 NOTE — ED Provider Notes (Signed)
History    CSN: [id number] Arrival date & time 08/25/12  1057  First MD Initiated Contact with Patient 08/25/12 1127     Chief Complaint  Patient presents with  . Medication Refill   (Consider location/radiation/quality/duration/timing/severity/associated sxs/prior Treatment) HPI  complains of left-sided parasternal nonradiating chest pain for one week constant, nonexertional. No cough no fever pain is pleuritic in nature. He also reports burning on urination for approximately one week he states he gets burning with urination each time that he gets chest pain. He's had several episodes of chest pain over the past year, with a coming approximately every 3 months. He requests prescription for azithromycin as last time he had chest pain he was prescribed azithromycin with relief. No treatment prior to coming here. No other associated symptoms. No urethral discharge. Pain is mild at present. Past Medical History  Diagnosis Date  . Asthma   . Chest pain     Evaluated  in Pulmonary clinic/ [facility]/ [place] / 02/23/2011     History reviewed. No pertinent past surgical history. No family history on file. History  Substance Use Topics  . Smoking status: Never Smoker   . Smokeless tobacco: Not on file  . Alcohol Use: No    Review of Systems  Constitutional: Negative.   HENT: Negative.   Respiratory: Negative.   Cardiovascular: Positive for chest pain.  Gastrointestinal: Negative.   Genitourinary: Positive for dysuria.  Musculoskeletal: Negative.   Skin: Negative.   Neurological: Negative.   Psychiatric/Behavioral: Negative.   All other systems reviewed and are negative.    Allergies  Review of patient's allergies indicates no known allergies.  Home Medications   Current Outpatient Rx  Name  Route  Sig  Dispense  Refill  . Multiple Vitamin (MULTIVITAMIN WITH MINERALS) TABS   Oral   Take 1 tablet by mouth daily.         . Pseudoeph-Doxylamine-DM-APAP (NYQUIL  PO)   Oral   Take 15 mLs by mouth at bedtime as needed (sleep).          BP 119/75  Pulse 61  Temp(Src) 98.5 F (36.9 C) (Oral)  Resp 16  SpO2 99% Physical Exam  Nursing note and vitals reviewed. Constitutional: He appears well-developed and well-nourished.  HENT:  Head: Normocephalic and atraumatic.  Eyes: Conjunctivae are normal. Pupils are equal, round, and reactive to light.  Neck: Neck supple. No tracheal deviation present. No thyromegaly present.  Cardiovascular: Normal rate and regular rhythm.   No murmur heard. Pulmonary/Chest: Effort normal and breath sounds normal.  Abdominal: Soft. Bowel sounds are normal. He exhibits no distension. There is no tenderness.  Genitourinary: Penis normal.  Normal circumcised male. No discharge. Scrotum normal  Musculoskeletal: Normal range of motion. He exhibits no edema and no tenderness.  Neurological: He is alert. Coordination normal.  Skin: Skin is warm and dry. No rash noted.  Psychiatric: He has a normal mood and affect.    ED Course  Procedures (including critical care time) Labs Reviewed - No data to display No results found. No diagnosis found.  Date: 08/25/2012  Rate: 65  Rhythm: normal sinus rhythm  QRS Axis: normal  Intervals: normal  ST/T Wave abnormalities: nonspecific T wave changes  Conduction Disutrbances:none  Narrative Interpretation:   Old EKG Reviewed: No significant change from third 15 2014 interpreted by me Results for orders placed during the hospital encounter of 08/25/12  URINALYSIS, ROUTINE W REFLEX MICROSCOPIC      Result Value Range  Color, Urine YELLOW  YELLOW   APPearance CLEAR  CLEAR   Specific Gravity, Urine 1.023  1.005 - 1.030   pH 5.5  5.0 - 8.0   Glucose, UA NEGATIVE  NEGATIVE mg/dL   Hgb urine dipstick TRACE (*) NEGATIVE   Bilirubin Urine NEGATIVE  NEGATIVE   Ketones, ur NEGATIVE  NEGATIVE mg/dL   Protein, ur NEGATIVE  NEGATIVE mg/dL   Urobilinogen, UA 0.2  0.0 - 1.0 mg/dL    Nitrite NEGATIVE  NEGATIVE   Leukocytes, UA NEGATIVE  NEGATIVE  URINE MICROSCOPIC-ADD ON      Result Value Range   Squamous Epithelial / LPF RARE  RARE   No results found.  1 PM patient resting comfortably. No distress. MDM  Strongly doubt acute coronary syndrome in this young male with atypical symptoms with only risk cardiac risk factor being coronary artery disease in his mother at an advanced age. Dysurias subjective. There is no evidence of urethritis on exam no pyuria. Plan referral to PMD. Diagnosis #1 atypical chest pain #2 dysuria  Doug Sou, MD 08/25/12 1312

## 2012-08-25 NOTE — ED Notes (Addendum)
Pt reports here for refill of azithromycin. Pt c/o pain in chest intermittent for past 3 months. Pt denies cold symptoms. Pt reports burning with urination. Pt reports having previous visits here for chest pain and the antibiotic medication makes it feel better.

## 2013-01-02 ENCOUNTER — Other Ambulatory Visit: Payer: Self-pay

## 2013-08-20 ENCOUNTER — Ambulatory Visit (INDEPENDENT_AMBULATORY_CARE_PROVIDER_SITE_OTHER): Payer: Self-pay | Admitting: Family Medicine

## 2013-08-20 VITALS — BP 128/82 | HR 67 | Temp 97.8°F | Resp 16 | Ht 70.0 in | Wt 155.0 lb

## 2013-08-20 DIAGNOSIS — Z0289 Encounter for other administrative examinations: Secondary | ICD-10-CM

## 2013-08-20 NOTE — Progress Notes (Addendum)
This chart was scribed for Darren Ray, MD by Darren Mccoy, ED Scribe. This patient was seen in room 13 and the patient's care was started at 4:18 PM.  Subjective:    Patient ID: Darren Mccoy, male    DOB: 1971/01/15, 43 y.o.   MRN: [id number]  Chief Complaint  Patient presents with  . Annual Exam    DOT PE    HPI Darren Mccoy is a 43 y.o. 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 See copy of paperwork, he has no positive responses on his health history form. He presents with a 2 year card with no prior restrictions.  He states that he is doing well. He denies any changes since his last visit. Pt denies snoring. He is not tired during the day. He also denies any eye problems, dysuria, muscle changes, abdominal pain, cough, SOB, headaches, fatigue, muscle changes, snoring, or sleep disturbances.   It was noted that pt was last seen in the ED 6/29 for chest pain. He states that he was drinking too much soda so they told him that he had too "much acid". He states that his EKG reading was within normal limits. Any acute coronary syndromes were ruled out.   Pt does not wear any corrective lenses.   PCP: Default, Provider, MD  Patient Active Problem List   Diagnosis Date Noted  . Chest pain 02/23/2011   Past Medical History  Diagnosis Date  . Asthma   . Chest pain     Evaluated  in Pulmonary clinic/ [facility]/ [facility] / 02/23/2011     History reviewed. No pertinent past surgical history. No Known Allergies Prior to Admission medications   Not on File   History   Social History  . Marital Status: Legally Separated    Spouse Name: N/A    Number of Children: 0  . Years of Education: N/A   Occupational History  . truck driver    Social History Main Topics  . Smoking status: Never Smoker   . Smokeless tobacco: Not on file  . Alcohol Use: No  . Drug Use: No  . Sexual Activity: Not on file   Other Topics Concern  . Not on file   Social  History Narrative  . No narrative on file     Review of Systems  Constitutional: Negative for fatigue and unexpected weight change.  Eyes: Negative for visual disturbance.  Respiratory: Negative for cough, chest tightness and shortness of breath.   Cardiovascular: Negative for chest pain, palpitations and leg swelling.  Gastrointestinal: Negative for abdominal pain and blood in stool.  Neurological: Negative for dizziness, light-headedness and headaches.  Negative other than any listed on the health history form     Objective:   Physical Exam  Vitals reviewed. Constitutional: He is oriented to person, place, and time. He appears well-developed and well-nourished.  HENT:  Head: Normocephalic and atraumatic.  Right Ear: External ear normal.  Left Ear: External ear normal.  Mouth/Throat: Oropharynx is clear and moist.  Eyes: Conjunctivae and EOM are normal. Pupils are equal, round, and reactive to light.  Neck: Normal range of motion. Neck supple. No thyromegaly present.  Cardiovascular: Normal rate, regular rhythm, normal heart sounds and intact distal pulses.   Pulmonary/Chest: Effort normal and breath sounds normal. No respiratory distress. He has no wheezes.  Abdominal: Soft. He exhibits no distension. There is no tenderness. Hernia confirmed negative in the right inguinal area and confirmed negative in the  left inguinal area.  Musculoskeletal: Normal range of motion. He exhibits no edema and no tenderness.  Lymphadenopathy:    He has no cervical adenopathy.  Neurological: He is alert and oriented to person, place, and time. He has normal reflexes.  Skin: Skin is warm and dry.  Psychiatric: He has a normal mood and affect. His behavior is normal.    Filed Vitals:   08/20/13 1521  BP: 128/82  Pulse: 67  Temp: 97.8 F (36.6 C)  TempSrc: Oral  Resp: 16  Height: 5\' 10"  (1.778 m)  Weight: 155 lb (70.308 kg)  SpO2: 97%    Visual Acuity Screening   Right eye Left eye Both  eyes  Without correction: 20/15 20/15 20/15   With correction:      Vision: able to differentiate between red, green, and amber colors. Hearing: right and left ear 10 feet.  Screening urine: negative protein, blood, and sugar.         Assessment & Plan:  Darren Mccoy is a 43 y.o. male Health examination of defined subpopulation  DOT physical. No concerns identified on exam/hx.  See ppwk.  2 year card.   No orders of the defined types were placed in this encounter.   Patient Instructions  2 year card - follow up with primary care doctor for regular medical care and physicals.       I personally performed the services described in this documentation, which was scribed in my presence. The recorded information has been reviewed and is accurate.

## 2013-08-20 NOTE — Patient Instructions (Signed)
2 year card - follow up with primary care doctor for regular medical care and physicals.

## 2014-04-02 ENCOUNTER — Encounter (HOSPITAL_COMMUNITY): Payer: Self-pay | Admitting: Emergency Medicine

## 2014-04-02 ENCOUNTER — Emergency Department (HOSPITAL_COMMUNITY): Payer: Self-pay

## 2014-04-02 ENCOUNTER — Emergency Department (HOSPITAL_COMMUNITY)
Admission: EM | Admit: 2014-04-02 | Discharge: 2014-04-02 | Disposition: A | Discharge status: Home / Self Care | Payer: Self-pay | Attending: Emergency Medicine | Admitting: Emergency Medicine

## 2014-04-02 DIAGNOSIS — R0789 Other chest pain: Secondary | ICD-10-CM | POA: Insufficient documentation

## 2014-04-02 DIAGNOSIS — J45909 Unspecified asthma, uncomplicated: Secondary | ICD-10-CM | POA: Insufficient documentation

## 2014-04-02 LAB — CBC WITH DIFFERENTIAL/PLATELET
BASOS ABS: 0 10*3/uL (ref 0.0–0.1)
BASOS PCT: 1 % (ref 0–1)
EOS ABS: 0.2 10*3/uL (ref 0.0–0.7)
Eosinophils Relative: 3 % (ref 0–5)
HCT: 42.8 % (ref 39.0–52.0)
HEMOGLOBIN: 14.9 g/dL (ref 13.0–17.0)
Lymphocytes Relative: 37 % (ref 12–46)
Lymphs Abs: 2.6 10*3/uL (ref 0.7–4.0)
MCH: 31 pg (ref 26.0–34.0)
MCHC: 34.8 g/dL (ref 30.0–36.0)
MCV: 89.2 fL (ref 78.0–100.0)
MONO ABS: 0.9 10*3/uL (ref 0.1–1.0)
MONOS PCT: 13 % — AB (ref 3–12)
NEUTROS ABS: 3.3 10*3/uL (ref 1.7–7.7)
NEUTROS PCT: 46 % (ref 43–77)
Platelets: 185 10*3/uL (ref 150–400)
RBC: 4.8 MIL/uL (ref 4.22–5.81)
RDW: 13.2 % (ref 11.5–15.5)
WBC: 6.8 10*3/uL (ref 4.0–10.5)

## 2014-04-02 LAB — I-STAT TROPONIN, ED: TROPONIN I, POC: 0 ng/mL (ref 0.00–0.08)

## 2014-04-02 LAB — I-STAT CHEM 8, ED
BUN: 19 mg/dL (ref 6–23)
CALCIUM ION: 1.05 mmol/L — AB (ref 1.12–1.23)
Chloride: 102 mmol/L (ref 96–112)
Creatinine, Ser: 1.3 mg/dL (ref 0.50–1.35)
GLUCOSE: 98 mg/dL (ref 70–99)
HEMATOCRIT: 46 % (ref 39.0–52.0)
HEMOGLOBIN: 15.6 g/dL (ref 13.0–17.0)
POTASSIUM: 3.8 mmol/L (ref 3.5–5.1)
Sodium: 139 mmol/L (ref 135–145)
TCO2: 26 mmol/L (ref 0–100)

## 2014-04-02 MED ORDER — KETOROLAC TROMETHAMINE 30 MG/ML IJ SOLN
30.0000 mg | Freq: Once | INTRAMUSCULAR | Status: AC
  Administered 2014-04-02: 30 mg via INTRAVENOUS
  Filled 2014-04-02: qty 1

## 2014-04-02 MED ORDER — IBUPROFEN 800 MG PO TABS: 800.0000 mg | ORAL_TABLET | Freq: Three times a day (TID) | ORAL | Status: AC

## 2014-04-02 NOTE — ED Provider Notes (Signed)
CSN: [id number]     Arrival date & time 04/02/14  1513 History   First MD Initiated Contact with Patient 04/02/14 1802     Chief Complaint  Patient presents with  . Chest Pain     HPI  Patient presents with concern of ongoing left-sided chest pain. Patient presents left upper chest since onset.  There is no associated nausea, vomiting, weight gain, swelling, dyspnea. 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 Patient has had similar episodes for years. Indeed, I evaluated the patient 2 years ago and states the pain has been similar age time it occurs. He has not followed up with a primary care physician, nor a cardiologist since his events began. Pain is not pleuritic or exertional. No medication taken for pain relief.  Past Medical History  Diagnosis Date  . Asthma   . Chest pain     Evaluated  in Pulmonary clinic/ [facility]/ [place] / 02/23/2011     History reviewed. No pertinent past surgical history. No family history on file. History  Substance Use Topics  . Smoking status: Never Smoker   . Smokeless tobacco: Not on file  . Alcohol Use: No    Review of Systems  Constitutional:       Per HPI, otherwise negative  HENT:       Per HPI, otherwise negative  Respiratory:       Per HPI, otherwise negative  Cardiovascular:       Per HPI, otherwise negative  Gastrointestinal: Negative for vomiting.  Endocrine:       Negative aside from HPI  Genitourinary:       Neg aside from HPI   Musculoskeletal:       Per HPI, otherwise negative  Skin: Negative.   Neurological: Negative for syncope.      Allergies  Review of patient's allergies indicates no known allergies.  Home Medications   Prior to Admission medications   Not on File   BP 128/83 mmHg  Pulse 78  Temp(Src) 97.9 F (36.6 C) (Oral)  Resp 16  Ht 5\' 10"  (1.778 m)  Wt 170 lb (77.111 kg)  BMI 24.39 kg/m2  SpO2 100% Physical Exam  Constitutional:  He is oriented to person, place, and time. He appears well-developed. No distress.  HENT:  Head: Normocephalic and atraumatic.  Eyes: Conjunctivae and EOM are normal.  Cardiovascular: Normal rate and regular rhythm.   Pulmonary/Chest: Effort normal. No stridor. No respiratory distress.  Tender to palpation about the upper left chest wall.  Abdominal: He exhibits no distension.  Musculoskeletal: He exhibits no edema.  Neurological: He is alert and oriented to person, place, and time.  Skin: Skin is warm and dry.  Psychiatric: He has a normal mood and affect.  Nursing note and vitals reviewed.   ED Course  Procedures (including critical care time) Labs Review Labs Reviewed  CBC WITH DIFFERENTIAL/PLATELET - Abnormal; Notable for the following:    Monocytes Relative 13 (*)    All other components within normal limits  I-STAT CHEM 8, ED - Abnormal; Notable for the following:    Calcium, Ion 1.05 (*)    All other components within normal limits  I-STAT TROPOININ, ED    Imaging Review Dg Chest 2 View  04/02/2014   CLINICAL DATA:  Chest pain and shortness of breath.  EXAM: CHEST  2 VIEW  COMPARISON:  04/26/2012  FINDINGS: The heart size and mediastinal contours  are within normal limits. Both lungs are clear. The visualized skeletal structures are unremarkable.  IMPRESSION: No active cardiopulmonary disease.   Electronically Signed   By: Kerby Moors M.D.   On: 04/02/2014 16:29     EKG Interpretation   Date/Time:  Thursday April 02 2014 15:18:28 EST Ventricular Rate:  68 PR Interval:  136 QRS Duration: 88 QT Interval:  392 QTC Calculation: 416 R Axis:   50 Text Interpretation:  Normal sinus rhythm Nonspecific T wave abnormality  Abnormal ECG Sinus rhythm Left ventricular hypertrophy T wave abnormality  No significant change since last tracing Abnormal ekg Confirmed by  Carmin Muskrat  MD [phone]) on 04/02/2014 6:10:52 PM     Repeat exam the patient is in no distress.  Pain  has improved with Toradol. I discussed the importance of following up with primary care and/or cardiology for remainder of his evaluation.  MDM  Patient presents after a recent recurrence of his intermittent chest pain. The patient is a truck driver, there is no lower extremity edema, and is not hypoxic, ischemic, tachycardic, and pulmonary embolus is not suspected. Patient has had prior imaging, including angiography that did not demonstrate pulmonary embolism. Patient has also had deferred angiography, demonstrating no substantial coronary lesions. Here the patient is awake, alert, vitals are stable, labs unremarkable, and after initiation of anti-inflammatories he was discharged in stable condition to follow-up with primary care.   Carmin Muskrat, MD 04/02/14 2020

## 2014-04-02 NOTE — Discharge Instructions (Signed)
As discussed, your evaluation today has been largely reassuring.  But, it is important that you monitor your condition carefully, and do not hesitate to return to the ED if you develop new, or concerning changes in your condition. ° °Otherwise, please follow-up with your physician for appropriate ongoing care. ° °Chest Pain (Nonspecific) °It is often hard to give a specific diagnosis for the cause of chest pain. There is always a chance that your pain could be related to something serious, such as a heart attack or a blood clot in the lungs. You need to follow up with your health care provider for further evaluation. °CAUSES  °· Heartburn. °· Pneumonia or bronchitis. °· Anxiety or stress. °· Inflammation around your heart (pericarditis) or lung (pleuritis or pleurisy). °· A blood clot in the lung. °· A collapsed lung (pneumothorax). It can develop suddenly on its own (spontaneous pneumothorax) or from trauma to the chest. °· Shingles infection (herpes zoster virus). °The chest wall is composed of bones, muscles, and cartilage. Any of these can be the source of the pain. °· The bones can be bruised by injury. °· The muscles or cartilage can be strained by coughing or overwork. °· The cartilage can be affected by inflammation and become sore (costochondritis). °DIAGNOSIS  °Lab tests or other studies may be needed to find the cause of your pain. Your health care provider may have you take a test called an ambulatory electrocardiogram (ECG). An ECG records your heartbeat patterns over a 24-hour period. You may also have other tests, such as: °· Transthoracic echocardiogram (TTE). During echocardiography, sound waves are used to evaluate how blood flows through your heart. °· Transesophageal echocardiogram (TEE). °· Cardiac monitoring. This allows your health care provider to monitor your heart rate and rhythm in real time. °· Holter monitor. This is a portable device that records your heartbeat and can help diagnose  heart arrhythmias. It allows your health care provider to track your heart activity for several days, if needed. °· Stress tests by exercise or by giving medicine that makes the heart beat faster. °TREATMENT  °· Treatment depends on what may be causing your chest pain. Treatment may include: °¨ Acid blockers for heartburn. °¨ Anti-inflammatory medicine. °¨ Pain medicine for inflammatory conditions. °¨ Antibiotics if an infection is present. °· You may be advised to change lifestyle habits. This includes stopping smoking and avoiding alcohol, caffeine, and chocolate. °· You may be advised to keep your head raised (elevated) when sleeping. This reduces the chance of acid going backward from your stomach into your esophagus. °Most of the time, nonspecific chest pain will improve within 2-3 days with rest and mild pain medicine.  °HOME CARE INSTRUCTIONS  °· If antibiotics were prescribed, take them as directed. Finish them even if you start to feel better. °· For the next few days, avoid physical activities that bring on chest pain. Continue physical activities as directed. °· Do not use any tobacco products, including cigarettes, chewing tobacco, or electronic cigarettes. °· Avoid drinking alcohol. °· Only take medicine as directed by your health care provider. °· Follow your health care provider's suggestions for further testing if your chest pain does not go away. °· Keep any follow-up appointments you made. If you do not go to an appointment, you could develop lasting (chronic) problems with pain. If there is any problem keeping an appointment, call to reschedule. °SEEK MEDICAL CARE IF:  °· Your chest pain does not go away, even after treatment. °· You have   a rash with blisters on your chest. °· You have a fever. °SEEK IMMEDIATE MEDICAL CARE IF:  °· You have increased chest pain or pain that spreads to your arm, neck, jaw, back, or abdomen. °· You have shortness of breath. °· You have an increasing cough, or you  cough up blood. °· You have severe back or abdominal pain. °· You feel nauseous or vomit. °· You have severe weakness. °· You faint. °· You have chills. °This is an emergency. Do not wait to see if the pain will go away. Get medical help at once. Call your local emergency services (911 in [place] Do not drive yourself to the hospital. °MAKE SURE YOU:  °· Understand these instructions. °· Will watch your condition. °· Will get help right away if you are not doing well or get worse. °Document Released: 11/23/2004 Document Revised: 02/18/2013 Document Reviewed: 09/19/2007 °[employer]® Patient Information ©2015 [employer]. This information is not intended to replace advice given to you by your health care provider. Make sure you discuss any questions you have with your health care provider. ° °

## 2014-04-02 NOTE — ED Notes (Signed)
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  Pt denies nausea or vomiting.  St's pain is constant.

## 2014-05-04 ENCOUNTER — Ambulatory Visit (INDEPENDENT_AMBULATORY_CARE_PROVIDER_SITE_OTHER): Payer: No Typology Code available for payment source | Admitting: Physician Assistant

## 2014-05-04 VITALS — BP 110/90 | HR 68 | Temp 97.9°F | Resp 16 | Ht 70.0 in | Wt 166.0 lb

## 2014-05-04 DIAGNOSIS — N400 Enlarged prostate without lower urinary tract symptoms: Secondary | ICD-10-CM | POA: Diagnosis not present

## 2014-05-04 DIAGNOSIS — R350 Frequency of micturition: Secondary | ICD-10-CM

## 2014-05-04 LAB — POCT UA - MICROSCOPIC ONLY
Bacteria, U Microscopic: NEGATIVE
CASTS, UR, LPF, POC: NEGATIVE
CRYSTALS, UR, HPF, POC: NEGATIVE
EPITHELIAL CELLS, URINE PER MICROSCOPY: NEGATIVE
MUCUS UA: NEGATIVE
WBC, UR, HPF, POC: NEGATIVE
YEAST UA: NEGATIVE

## 2014-05-04 LAB — POCT URINALYSIS DIPSTICK
BILIRUBIN UA: NEGATIVE
Blood, UA: NEGATIVE
Glucose, UA: NEGATIVE
KETONES UA: NEGATIVE
LEUKOCYTES UA: NEGATIVE
Nitrite, UA: NEGATIVE
PH UA: 7.5
PROTEIN UA: NEGATIVE
SPEC GRAV UA: 1.015
Urobilinogen, UA: 0.2

## 2014-05-04 LAB — GLUCOSE, POCT (MANUAL RESULT ENTRY): POC GLUCOSE: 76 mg/dL (ref 70–99)

## 2014-05-04 MED ORDER — TAMSULOSIN HCL 0.4 MG PO CAPS: 0.4000 mg | ORAL_CAPSULE | Freq: Every day | ORAL | Status: DC

## 2014-05-04 NOTE — Patient Instructions (Addendum)
I think your symptoms are most likely due to bph as below. Please take the flomax once daily 30 minutes after a meal. If this doesn't improve your symptoms in one month, you can increase the dose to 2 pills once a day. If this doesn't improve your symptoms, please let us know as we should refer you to a urologist to make sure nothing else is causing your symptoms.  Your urinary sample and blood sugar were normal.   Benign Prostatic Hyperplasia An enlarged prostate (benign prostatic hyperplasia) is common in older men. You may experience the following:  Weak urine stream.  Dribbling.  Feeling like the bladder has not emptied completely.  Difficulty starting urination.  Getting up frequently at night to urinate.  Urinating more frequently during the day. HOME CARE INSTRUCTIONS  Monitor your prostatic hyperplasia for any changes. The following actions may help to alleviate any discomfort you are experiencing:  Give yourself time when you urinate.  Stay away from alcohol.  Avoid beverages containing caffeine, such as coffee, tea, and colas, because they can make the problem worse.  Avoid decongestants, antihistamines, and some prescription medicines that can make the problem worse.  Follow up with your health care provider for further treatment as recommended. SEEK MEDICAL CARE IF:  You are experiencing progressive difficulty voiding.  Your urine stream is progressively getting narrower.  You are awaking from sleep with the urge to void more frequently.  You are constantly feeling the need to void.  You experience loss of urine, especially in small amounts. SEEK IMMEDIATE MEDICAL CARE IF:   You develop increased pain with urination or are unable to urinate.  You develop severe abdominal pain, vomiting, a high fever, or fainting.  You develop back pain or blood in your urine. MAKE SURE YOU:   Understand these instructions.  Will watch your condition.  Will get help  right away if you are not doing well or get worse. Document Released: 02/13/2005 Document Revised: 10/16/2012 Document Reviewed: 07/16/2012 [facility] Patient Information 2015 [place], [place]. This information is not intended to replace advice given to you by your health care provider. Make sure you discuss any questions you have with your health care provider.

## 2014-05-04 NOTE — Progress Notes (Signed)
Subjective:    Patient ID: Darren Mccoy, male    DOB: 10-31-1970, 44 y.o.   MRN: [id number]  Chief Complaint  Patient presents with  . Polyuria    Frequency Onset 2 to 3 months   Patient Active Problem List   Diagnosis Date Noted  . BPH (benign prostatic hyperplasia) 05/04/2014  . Chest pain 02/23/2011   Medications, allergies, past medical history, surgical history, family history, social history and problem list reviewed and updated.  HPI  62 yom with no significant pmh presents with 3 month h/o increased urinary freq.   Sx have been gradual onset over past few months. Feels he is peeing more often than usual. Approx once per hour. Feels like he has incomplete emptying and sometimes has to strain to urinate. Feels like his urinary stream is splitting. Having increased nighttime urination. Denies urinary dribbling. Denies dysuria, increased urgency. Denies abd pain. Denies hematuria. Denies polydipsia. Denies erectile dysfx.   Drinks mod amnt caffeine. No alcohol.   Review of Systems No diarrhea.     Objective:   Physical Exam  Constitutional: He is oriented to person, place, and time. He appears well-developed and well-nourished.  Non-toxic appearance. He does not have a sickly appearance. He does not appear ill. No distress.  BP 110/90 mmHg  Pulse 68  Temp(Src) 97.9 F (36.6 C) (Oral)  Resp 16  Ht 5\' 10"  (1.778 m)  Wt 166 lb (75.297 kg)  BMI 23.82 kg/m2  SpO2 99%   Abdominal: Soft. Normal appearance and bowel sounds are normal. There is no tenderness. There is no CVA tenderness.  Genitourinary: Prostate is enlarged. Prostate is not tender.  Prostate mildly enlarged. No nodules. No induration. Non tender on palpation.   Neurological: He is alert and oriented to person, place, and time.  Psychiatric: He has a normal mood and affect. His speech is normal.   AUA symptom score: 16. Moderate symptoms.   Results for orders placed or performed in visit on 05/04/14  POCT  UA - Microscopic Only  Result Value Ref Range   WBC, Ur, HPF, POC neg    RBC, urine, microscopic 0-2    Bacteria, U Microscopic neg    Mucus, UA neg    Epithelial cells, urine per micros neg    Crystals, Ur, HPF, POC neg    Casts, Ur, LPF, POC neg    Yeast, UA neg   POCT urinalysis dipstick  Result Value Ref Range   Color, UA yellow    Clarity, UA clear    Glucose, UA neg    Bilirubin, UA neg    Ketones, UA neg    Spec Grav, UA 1.015    Blood, UA neg    pH, UA 7.5    Protein, UA neg    Urobilinogen, UA 0.2    Nitrite, UA neg    Leukocytes, UA Negative   POCT glucose (manual entry)  Result Value Ref Range   POC Glucose 76 70 - 99 mg/dl      Assessment & Plan:   43 yom with no significant pmh presents with 3 month h/o increased urinary freq.   Frequent urination - Plan: POCT UA - Microscopic Only, POCT urinalysis dipstick, POCT glucose (manual entry) --normal ua no uti, normal bg no undiagnosed dm2 --sx most likely due to bph as below  BPH (benign prostatic hyperplasia) - Plan: tamsulosin (FLOMAX) 0.4 MG CAPS capsule --enlarged prostate on rectal exam --16 on AUA symptom score screen indicates moderate sx --  normal bun/cr on bmp one month ago --start flomax 0.4 mg qd, increase to 0.8 mg qd after one month if sx persist --pt instructed to contact us if sx persist likely refer to urology for further workup   Julieta Gutting, PA-C Physician Assistant-Certified [facility]  05/05/2014 2:36 PM

## 2014-05-05 ENCOUNTER — Telehealth: Payer: Self-pay | Admitting: Physician Assistant

## 2014-05-05 NOTE — Telephone Encounter (Signed)
Called, left msg with pt. Instructed to call me back. I would like to have him come in to have psa drawn sometime in next week or so. Will place future order pending his call back.

## 2015-05-19 ENCOUNTER — Emergency Department (HOSPITAL_BASED_OUTPATIENT_CLINIC_OR_DEPARTMENT_OTHER): Payer: Self-pay

## 2015-05-19 ENCOUNTER — Other Ambulatory Visit (HOSPITAL_COMMUNITY): Payer: Self-pay

## 2015-05-19 ENCOUNTER — Observation Stay (HOSPITAL_COMMUNITY)
Admission: EM | Admit: 2015-05-19 | Discharge: 2015-05-20 | Disposition: A | Discharge status: Home / Self Care | Payer: Self-pay | Attending: Internal Medicine | Admitting: Internal Medicine

## 2015-05-19 ENCOUNTER — Encounter (HOSPITAL_COMMUNITY): Payer: Self-pay | Admitting: Emergency Medicine

## 2015-05-19 ENCOUNTER — Emergency Department (HOSPITAL_COMMUNITY): Payer: Self-pay

## 2015-05-19 DIAGNOSIS — R9431 Abnormal electrocardiogram [ECG] [EKG]: Secondary | ICD-10-CM | POA: Insufficient documentation

## 2015-05-19 DIAGNOSIS — R079 Chest pain, unspecified: Secondary | ICD-10-CM | POA: Diagnosis present

## 2015-05-19 DIAGNOSIS — J020 Streptococcal pharyngitis: Secondary | ICD-10-CM | POA: Diagnosis present

## 2015-05-19 DIAGNOSIS — R1031 Right lower quadrant pain: Secondary | ICD-10-CM | POA: Insufficient documentation

## 2015-05-19 DIAGNOSIS — R52 Pain, unspecified: Secondary | ICD-10-CM

## 2015-05-19 DIAGNOSIS — I5032 Chronic diastolic (congestive) heart failure: Secondary | ICD-10-CM | POA: Insufficient documentation

## 2015-05-19 DIAGNOSIS — I309 Acute pericarditis, unspecified: Secondary | ICD-10-CM | POA: Diagnosis present

## 2015-05-19 DIAGNOSIS — I319 Disease of pericardium, unspecified: Principal | ICD-10-CM | POA: Insufficient documentation

## 2015-05-19 DIAGNOSIS — B950 Streptococcus, group A, as the cause of diseases classified elsewhere: Secondary | ICD-10-CM | POA: Insufficient documentation

## 2015-05-19 HISTORY — DX: Angina pectoris, unspecified: I20.9

## 2015-05-19 LAB — CBC
HCT: 41.9 % (ref 39.0–52.0)
Hemoglobin: 13.8 g/dL (ref 13.0–17.0)
MCH: 29.7 pg (ref 26.0–34.0)
MCHC: 32.9 g/dL (ref 30.0–36.0)
MCV: 90.1 fL (ref 78.0–100.0)
PLATELETS: 279 10*3/uL (ref 150–400)
RBC: 4.65 MIL/uL (ref 4.22–5.81)
RDW: 13.2 % (ref 11.5–15.5)
WBC: 7.5 10*3/uL (ref 4.0–10.5)

## 2015-05-19 LAB — URINE MICROSCOPIC-ADD ON
Bacteria, UA: NONE SEEN
SQUAMOUS EPITHELIAL / LPF: NONE SEEN
WBC UA: NONE SEEN WBC/hpf (ref 0–5)

## 2015-05-19 LAB — RAPID URINE DRUG SCREEN, HOSP PERFORMED
AMPHETAMINES: NOT DETECTED
Barbiturates: NOT DETECTED
Benzodiazepines: NOT DETECTED
Cocaine: NOT DETECTED
Opiates: NOT DETECTED
TETRAHYDROCANNABINOL: NOT DETECTED

## 2015-05-19 LAB — URINALYSIS, ROUTINE W REFLEX MICROSCOPIC
Bilirubin Urine: NEGATIVE
GLUCOSE, UA: NEGATIVE mg/dL
Ketones, ur: 40 mg/dL — AB
Leukocytes, UA: NEGATIVE
Nitrite: NEGATIVE
Protein, ur: NEGATIVE mg/dL
SPECIFIC GRAVITY, URINE: 1.025 (ref 1.005–1.030)
pH: 5.5 (ref 5.0–8.0)

## 2015-05-19 LAB — BASIC METABOLIC PANEL
Anion gap: 11 (ref 5–15)
BUN: 16 mg/dL (ref 6–20)
CALCIUM: 9.1 mg/dL (ref 8.9–10.3)
CO2: 25 mmol/L (ref 22–32)
CREATININE: 1.1 mg/dL (ref 0.61–1.24)
Chloride: 102 mmol/L (ref 101–111)
GFR calc Af Amer: >60 mL/min (ref >60)
GFR calc non Af Amer: >60 mL/min (ref >60)
GLUCOSE: 90 mg/dL (ref 65–99)
Potassium: 4.1 mmol/L (ref 3.5–5.1)
Sodium: 138 mmol/L (ref 135–145)

## 2015-05-19 LAB — I-STAT TROPONIN, ED: Troponin i, poc: 0 ng/mL (ref 0.00–0.08)

## 2015-05-19 LAB — RAPID STREP SCREEN (MED CTR MEBANE ONLY): STREPTOCOCCUS, GROUP A SCREEN (DIRECT): POSITIVE — AB

## 2015-05-19 MED ORDER — KETOROLAC TROMETHAMINE 30 MG/ML IJ SOLN
30.0000 mg | Freq: Three times a day (TID) | INTRAMUSCULAR | Status: DC
  Administered 2015-05-19: 30 mg via INTRAVENOUS
  Filled 2015-05-19: qty 1

## 2015-05-19 MED ORDER — ONDANSETRON HCL 4 MG/2ML IJ SOLN: 4.0000 mg | Freq: Four times a day (QID) | INTRAMUSCULAR | Status: DC | PRN

## 2015-05-19 MED ORDER — IBUPROFEN 800 MG PO TABS: 800.0000 mg | ORAL_TABLET | Freq: Once | ORAL | Status: DC

## 2015-05-19 MED ORDER — OXYCODONE HCL 5 MG PO TABS: 5.0000 mg | ORAL_TABLET | ORAL | Status: DC | PRN

## 2015-05-19 MED ORDER — VANCOMYCIN HCL IN DEXTROSE 1-5 GM/200ML-% IV SOLN
1000.0000 mg | Freq: Once | INTRAVENOUS | Status: AC
  Administered 2015-05-19: 1000 mg via INTRAVENOUS
  Filled 2015-05-19: qty 200

## 2015-05-19 MED ORDER — SODIUM CHLORIDE 0.9% FLUSH
3.0000 mL | Freq: Two times a day (BID) | INTRAVENOUS | Status: DC
  Administered 2015-05-20: 3 mL via INTRAVENOUS

## 2015-05-19 MED ORDER — ACETAMINOPHEN 325 MG PO TABS: 650.0000 mg | ORAL_TABLET | Freq: Four times a day (QID) | ORAL | Status: DC | PRN

## 2015-05-19 MED ORDER — SODIUM CHLORIDE 0.9 % IV BOLUS (SEPSIS)
30.0000 mL/kg | Freq: Once | INTRAVENOUS | Status: AC
  Administered 2015-05-19: 2274 mL via INTRAVENOUS

## 2015-05-19 MED ORDER — DEXTROSE 5 % IV SOLN
2.0000 g | Freq: Once | INTRAVENOUS | Status: AC
  Administered 2015-05-19: 2 g via INTRAVENOUS
  Filled 2015-05-19: qty 2

## 2015-05-19 MED ORDER — SODIUM CHLORIDE 0.9 % IV SOLN: 250.0000 mL | INTRAVENOUS | Status: DC | PRN

## 2015-05-19 MED ORDER — HYDROXYZINE HCL 50 MG PO TABS: 50.0000 mg | ORAL_TABLET | Freq: Every evening | ORAL | Status: DC | PRN

## 2015-05-19 MED ORDER — SODIUM CHLORIDE 0.9% FLUSH: 3.0000 mL | INTRAVENOUS | Status: DC | PRN

## 2015-05-19 MED ORDER — KETOROLAC TROMETHAMINE 30 MG/ML IJ SOLN
30.0000 mg | Freq: Three times a day (TID) | INTRAMUSCULAR | Status: DC
  Administered 2015-05-20 (×2): 30 mg via INTRAVENOUS
  Filled 2015-05-19 (×2): qty 1

## 2015-05-19 MED ORDER — ONDANSETRON HCL 4 MG PO TABS: 4.0000 mg | ORAL_TABLET | Freq: Four times a day (QID) | ORAL | Status: DC | PRN

## 2015-05-19 MED ORDER — ENOXAPARIN SODIUM 40 MG/0.4ML ~~LOC~~ SOLN
40.0000 mg | SUBCUTANEOUS | Status: DC
  Administered 2015-05-19: 40 mg via SUBCUTANEOUS
  Filled 2015-05-19: qty 0.4

## 2015-05-19 MED ORDER — GI COCKTAIL ~~LOC~~: 30.0000 mL | Freq: Four times a day (QID) | ORAL | Status: DC | PRN

## 2015-05-19 MED ORDER — SODIUM CHLORIDE 0.9 % IV SOLN
INTRAVENOUS | Status: DC
  Administered 2015-05-19: 125 mL/h via INTRAVENOUS
  Administered 2015-05-20: 04:00:00 via INTRAVENOUS

## 2015-05-19 MED ORDER — PANTOPRAZOLE SODIUM 40 MG PO TBEC
40.0000 mg | DELAYED_RELEASE_TABLET | Freq: Every day | ORAL | Status: DC
  Administered 2015-05-19 – 2015-05-20 (×2): 40 mg via ORAL
  Filled 2015-05-19 (×2): qty 1

## 2015-05-19 NOTE — ED Notes (Signed)
Pt given urinal and made aware of need for urine specimen 

## 2015-05-19 NOTE — ED Notes (Signed)
Bed: WA08 Expected date:  Expected time:  Means of arrival:  Comments: Triage 2 

## 2015-05-19 NOTE — ED Provider Notes (Signed)
MSE was initiated and I personally evaluated the patient and placed orders (if any) at  3:00 PM on May 19, 2015.  Asked to see patient due to abnormal ECG. Appears to have diffuse ST elevation, likely pericarditis. 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 Discharge papers he has says he was diagnosed with Acute Pericarditis. His main complaint is right groin pain at cath site. Low grade fever here. Does not appear septic. Will start infectious workup. D/w Dr. Angelena Form of cards who states if he looks ill like a STEMI (he has only mild pain, more concerned about groin) then call it but otherwise cycle enzymes. Troponin pending  The patient appears stable so that the remainder of the MSE may be completed by another provider.   Sherwood Gambler, MD 05/19/15 [phone]

## 2015-05-19 NOTE — ED Provider Notes (Addendum)
CSN: [id number]     Arrival date & time 05/19/15  1357 History   First MD Initiated Contact with Patient 05/19/15 1506     Chief Complaint  Patient presents with  . Fever     (Consider location/radiation/quality/duration/timing/severity/associated sxs/prior Treatment) HPI Patient is a long distance truck driver. He denies any past medical history. He is from [place] and has lived in [place] since 2001. He reports his last trip to [place] was approximately 2-3 years ago. He denies any significant childhood illnesses. He was on a over the road driving trip when in [place] he started to develop illness that precipitated a visit to the emergency department and subsequent hospitalization. He reports the day he sought treatment, he started feeling somewhat generally unwell. He describes starting to feel ill followed by some chills and subjective fever followed by sharp chest pain, shortness of breath and aching in his legs. He went to the emergency department in [place], [place]. He has discharge instructions that do not include all the details of his hospitalization. He did however undergo a cardiac catheterization which is documented as [id number]. No interpretation or result is present in the discharge papers. Patient verbally indicates he was told that that part of his test was okay and there is no description of having gotten stents placed. His discharge diagnoses were acute chest pain and acute pericarditis. Patient reports he was discharged from the hospital 5 days ago. He reports upon his discharge she went back to his motel and due to lack of transportation was unable to get any of the medications that were prescribed for him. His company subsequently flew him home on Sunday. He now presents to the emergency department stating he has pain both in his right groin which is aching and deep. He reports is painful with movement or pressure. Denies pain into the testicle. This was site of his catheter  insertion he reports. He also reports ongoing chest pain which is sharp in nature at this is central and somewhat to the left. He denies development of any significant amount of coughing. He continues to feel subjective fever and chills. Past Medical History  Diagnosis Date  . Asthma   . Chest pain     Evaluated  in Pulmonary clinic/ [facility]/ [place] / 02/23/2011    . Acute angina ([place])    History reviewed. No pertinent past surgical history. History reviewed. No pertinent family history. Social History  Substance Use Topics  . Smoking status: Never Smoker   . Smokeless tobacco: None  . Alcohol Use: No    Review of Systems 10 Systems reviewed and are negative for acute change except as noted in the HPI.    Allergies  Review of patient's allergies indicates no known allergies.  Home Medications   Prior to Admission medications   Medication Sig Start Date End Date Taking? Authorizing Provider  acetaminophen (TYLENOL) 325 MG tablet Take 650 mg by mouth daily as needed for mild pain or moderate pain.   Yes Historical Provider, MD  ibuprofen (ADVIL,MOTRIN) 200 MG tablet Take 400 mg by mouth every 6 (six) hours as needed for headache or moderate pain.   Yes Historical Provider, MD  tamsulosin (FLOMAX) 0.4 MG CAPS capsule Take 1 capsule (0.4 mg total) by mouth daily. Patient not taking: Reported on 05/19/2015 05/04/14   Araceli Bouche, PA   BP 109/78 mmHg  Pulse 102  Temp(Src) 101.3 F (38.5 C) (Oral)  Resp 19  Ht 5\' 10"  (1.778 m)  Wt 167 lb (75.751 kg)  BMI 23.96 kg/m2  SpO2 100% Physical Exam  Constitutional: He is oriented to person, place, and time. He appears well-developed and well-nourished.  Patient is nontoxic and alert. He is physically well-developed. He has no respiratory distress.  HENT:  Head: Normocephalic and atraumatic.  Nose: Nose normal.  Posterior oropharynx is widely patent. Patient has apparently congenitally absent uvula. Mild erythema of the  tonsillar pillars, no exudate or erosions.  Eyes: EOM are normal. Pupils are equal, round, and reactive to light. No scleral icterus.  Neck: Neck supple. No thyromegaly present.  Cardiovascular: Normal rate, regular rhythm, normal heart sounds and intact distal pulses.   Pulmonary/Chest: Effort normal and breath sounds normal.  Abdominal: Soft. Bowel sounds are normal. He exhibits no distension. There is no tenderness.  Genitourinary:  There are approximately 4 eroded areas on the uppermost aspect of the scrotum on the left. There is no surrounding erythema. There is no discharge from these areas they're dry and hyperpigmented. Patient does not endorse tenderness of the testicles palpation. No lesions are present on the testicles. A does endorse significant tenderness to palpation in the right groin. The pulse is 2+. I do not appreciate thrill and there is no hematoma. To visual inspection of the area is normal without evidence of secondary complication from cardiac catheterization insertion.  Musculoskeletal: Normal range of motion. He exhibits no edema or tenderness.  Lymphadenopathy:    He has no cervical adenopathy.  Neurological: He is alert and oriented to person, place, and time. He has normal strength. Coordination normal. GCS eye subscore is 4. GCS verbal subscore is 5. GCS motor subscore is 6.  Skin: Skin is warm, dry and intact.  Psychiatric: He has a normal mood and affect.    ED Course  Procedures (including critical care time) CRITICAL CARE Performed by: Charlesetta Shanks   Total critical care time:45 minutes  Critical care time was exclusive of separately billable procedures and treating other patients.  Critical care was necessary to treat or prevent imminent or life-threatening deterioration.  Critical care was time spent personally by me on the following activities: development of treatment plan with patient and/or surrogate as well as nursing, discussions with  consultants, evaluation of patient's response to treatment, examination of patient, obtaining history from patient or surrogate, ordering and performing treatments and interventions, ordering and review of laboratory studies, ordering and review of radiographic studies, pulse oximetry and re-evaluation of patient's condition. Labs Review Labs Reviewed  RAPID STREP SCREEN (NOT AT [facility]) - Abnormal; Notable for the following:    Streptococcus, Group A Screen (Direct) POSITIVE (*)    All other components within normal limits  URINALYSIS, ROUTINE W REFLEX MICROSCOPIC (NOT AT [facility]) - Abnormal; Notable for the following:    Color, Urine AMBER (*)    APPearance CLOUDY (*)    Hgb urine dipstick MODERATE (*)    Ketones, ur 40 (*)    All other components within normal limits  CULTURE, BLOOD (ROUTINE X 2)  CULTURE, BLOOD (ROUTINE X 2)  BASIC METABOLIC PANEL  CBC  URINE RAPID DRUG SCREEN, HOSP PERFORMED  URINE MICROSCOPIC-ADD ON  ANTISTREPTOLYSIN O TITER  INFLUENZA PANEL BY PCR (TYPE A & B, H1N1)  HSV(HERPES SMPLX)ABS-I+II(IGG+IGM)-BLD  RPR  HIV ANTIBODY (ROUTINE TESTING)  BASIC METABOLIC PANEL  I-STAT TROPOININ, ED  GC/CHLAMYDIA PROBE AMP () NOT AT [facility]    Imaging Review Dg Chest 2 View  05/19/2015  CLINICAL DATA:  Chest pain for 1  week EXAM: CHEST  2 VIEW COMPARISON:  04/02/2014 FINDINGS: Cardiac shadow is within normal limits. The lungs are well aerated bilaterally. Very minimal lingular infiltrate is seen. No sizable effusion is noted. No bony abnormality is seen. IMPRESSION: Mild lingular infiltrate. Electronically Signed   By: Inez Catalina M.D.   On: 05/19/2015 15:26   I have personally reviewed and evaluated these images and lab results as part of my medical decision-making.   EKG Interpretation   Date/Time:  Wednesday May 19 2015 14:40:18 EDT Ventricular Rate:  92 PR Interval:  123 QRS Duration: 79 QT Interval:  325 QTC Calculation: 402 R Axis:   34 Text  Interpretation:  Sinus rhythm Diffuse ST elevations, no reciprocal  changes ?PR depression Changes noted since Feb 2016 Confirmed by Regenia Skeeter   MD, [place] [phone]) on 05/19/2015 3:07:15 PM     Consult cardiology: Dr. Scarlette Calico has evaluated the patient in the emergency department. Patient will be admitted to hospitalist service.  Consult: Dr. Olevia Bowens for [facility] for admission. MDM   Final diagnoses:  Pain  Groin pain, right  Chest pain, unspecified chest pain type  Pericarditis  Strep pharyngitis   Patient has had ongoing chest pain which had diagnostic evaluation at the [facility] facility. At this time, EKG is most suggestive of pericarditis. This was reviewed with Dr.Varanassi. After reviewing the patient's history present illness, I felt his symptoms were most suggestive of infectious etiology at onset. He does have fever here as well. Rapid strep has returned positive. At this time, I will initiate empiric antibiotics for endocarditis until echo was completed and blood cultures are returned. Patient is stable emergency department. His mental status is clear respiratory status is stable and he does not have appreciable murmur on exam. Other notable finding on examination was patient's groin pain on the right. The identified this as the site of his cardiac catheterization. Objectively there is no abnormality in terms of hematoma or erythema. Ultrasound does not show pseudoaneurysm or leak or dissection. Upon this examination there were several small, fairly innocuous-looking lesions on the scrotum. Due to this finding HSV, RPR and HIV have been added as part of evaluation for possible infectious source. Patient is originally from the [place] but has not traveled there within the past 2-3 years.    Charlesetta Shanks, MD 05/19/15 2033  Charlesetta Shanks, MD 05/19/15 2033

## 2015-05-19 NOTE — Progress Notes (Signed)
VASCULAR LAB PRELIMINARY  PRELIMINARY  PRELIMINARY  PRELIMINARY   Right lower extremity duplex completed.    Preliminary report: No evidence of right groin pseudoaneurysm or A-V fistula Doppler signals are triphasic throughout the thigh  Drayven Marchena, RVS 05/19/2015, 4:29 PM

## 2015-05-19 NOTE — H&P (Signed)
[facility] History and Physical  Hamadi Goldmann [id number] DOB: 1970/03/21 DOA: 05/19/2015  Referring physician: Charlesetta Shanks, MD PCP: Default, Provider, MD   Chief Complaint: Fever.  HPI: Hisashi Cockerham is a 45 y.o. male with past medical history of mild intermittent asthma who is coming to the emergency department due to fever and chest pain since Thursday last week.  Per patient, he works as a Pharmacist, community and was driving last week in [place] when he started developing shortness of breath with significant chest pain. He describes the pain as sharp, nonradiating, pleuritic. He states that the pain was so intense that he had to stop on a highway rest stop and call the ambulance. He was subsequently taken to the emergency department at a hospital in [place] where he underwent a cardiac catheterization on 05/13/2015 which per patient did not show any coronary arteries pathology. He was subsequently discharge from the hospital and went to his motel room. He was given prescriptions at discharge, but he was unable to fill them up. He does not know what the prescriptions were and he left them at his motel room and [place]. He was flown back to this area by his company on Sunday.  Associated symptoms since then had been headache, mild rhinorrhea, sore throat, body aches, particularly of the lower extremities. He denies earaches, productive cough, nausea, vomiting, diarrhea, constipation, melena, hematochezia or GU symptoms.  The patient also complained of right groin pain in the area of the cardiac catheter insertion.  Doppler ultrasound of the area, performed earlier in the emergency department, did not show any pseudoaneurysm or any other abnormalities.  When seen in the emergency department, the patient was in no acute distress. He stated he was feeling better from his pain after analgesics were given. Workup is significant for microscopic hematuria and positive rapid strep A pharyngeal swab.  He denies any previous history of rheumatic fever or any significant childhood diseases.   Review of Systems:  Constitutional:  Positive Fevers, chills, fatigue.  No weight loss, night sweats HEENT:  Positive headaches, sore throat.  Denies difficulty swallowing,Tooth/dental problems.  No sneezing, itching, ear ache, nasal congestion, post nasal drip,  Cardio-vascular:  Positive chest pain. Denies orthopnea, PND, swelling in lower extremities, anasarca, dizziness, palpitations  GI:  No heartburn, indigestion, abdominal pain, nausea, vomiting, diarrhea, change in bowel habits, loss of appetite  Resp:  Positive dyspnea with chest pain last week. No productive cough, wheezing or hemoptysis. Skin:  no rash or lesions.  GU:  no dysuria, change in color of urine, no urgency or frequency. No flank pain.  Musculoskeletal:  No joint pain or swelling. No decreased range of motion. No back pain.  Psych:  No change in mood or affect. No depression or anxiety. No memory loss.   Past Medical History  Diagnosis Date  . Asthma   . Chest pain     Evaluated  in Pulmonary clinic/ [facility]/ [place] / 02/23/2011    . Acute angina ([place])    History reviewed. No pertinent past surgical history. Social History:  reports that he has never smoked. He does not have any smokeless tobacco history on file. He reports that he does not drink alcohol or use illicit drugs.  No Known Allergies  History reviewed. No pertinent family history.  Prior to Admission medications   Medication Sig Start Date End Date Taking? Authorizing Provider  acetaminophen (TYLENOL) 325 MG tablet Take 650 mg by mouth daily as needed for mild pain or  moderate pain.   Yes Historical Provider, MD  ibuprofen (ADVIL,MOTRIN) 200 MG tablet Take 400 mg by mouth every 6 (six) hours as needed for headache or moderate pain.   Yes Historical Provider, MD  tamsulosin (FLOMAX) 0.4 MG CAPS capsule Take 1 capsule (0.4 mg total) by mouth  daily. Patient not taking: Reported on 05/19/2015 05/04/14   Araceli Bouche, PA   Physical Exam: Filed Vitals:   05/19/15 1851 05/19/15 1900 05/19/15 1952 05/19/15 1954  BP: 105/67 108/77  109/78  Pulse: 67   102  Temp: 100.3 F (37.9 C)   101.3 F (38.5 C)  TempSrc: Oral   Oral  Resp: 17 23  19   Height:   5\' 10"  (1.778 m)   Weight:   75.751 kg (167 lb)   SpO2: 93%   100%    Wt Readings from Last 3 Encounters:  05/19/15 75.751 kg (167 lb)  05/04/14 75.297 kg (166 lb)  04/02/14 77.111 kg (170 lb)    General:  Appears calm and comfortable Eyes: PERRL, normal lids, irises & conjunctiva ENT: grossly normal hearing, lips & tongue. Mild pharyngeal mucosa erythema. Neck: no LAD, masses or thyromegaly Cardiovascular: RRR, no m/r/g. No LE edema. Telemetry: SR, no arrhythmias  Respiratory: CTA bilaterally, no w/r/r. Normal respiratory effort. Abdomen: soft, ntnd Skin: no rash or induration seen on limited exam Musculoskeletal: grossly normal tone BUE/BLE Psychiatric: grossly normal mood and affect, speech fluent and appropriate Neurologic: Awake, alert, oriented 4, grossly non-focal.          Labs on Admission:  Basic Metabolic Panel:  Recent Labs Lab 05/19/15 1511  NA 138  K 4.1  CL 102  CO2 25  GLUCOSE 90  BUN 16  CREATININE 1.10  CALCIUM 9.1   CBC:  Recent Labs Lab 05/19/15 1511  WBC 7.5  HGB 13.8  HCT 41.9  MCV 90.1  PLT 279   Urinalysis, Routine w reflex microscopic [id number] (Abnormal) Collected: 05/19/15 1825    Updated: 05/19/15 1852    Specimen Type: Urine    Specimen Source: Urine, Clean Catch     Color, Urine AMBER (A)    APPearance CLOUDY (A)    Specific Gravity, Urine 1.025    pH 5.5    Glucose, UA NEGATIVE mg/dL    Hgb urine dipstick MODERATE (A)    Bilirubin Urine NEGATIVE    Ketones, ur 40 (A) mg/dL    Protein, ur NEGATIVE mg/dL    Nitrite NEGATIVE    Leukocytes, UA NEGATIVE   Urine rapid drug screen (hosp performed)  [id number] Collected: 05/19/15 1825   Updated: 05/19/15 1844    Specimen Type: Urine    Specimen Source: Urine, Clean Catch     Opiates NONE DETECTED    Cocaine NONE DETECTED    Benzodiazepines NONE DETECTED    Amphetamines NONE DETECTED    Tetrahydrocannabinol NONE DETECTED    Barbiturates NONE DETECTED   Rapid strep screen [id number] (Abnormal) Collected: 05/19/15 1615   Updated: 05/19/15 1643    Specimen Type: Other     Streptococcus, Group A Screen (Direct) POSITIVE (A)    Radiological Exams on Admission: Dg Chest 2 View  05/19/2015  CLINICAL DATA:  Chest pain for 1 week EXAM: CHEST  2 VIEW COMPARISON:  04/02/2014 FINDINGS: Cardiac shadow is within normal limits. The lungs are well aerated bilaterally. Very minimal lingular infiltrate is seen. No sizable effusion is noted. No bony abnormality is seen. IMPRESSION: Mild lingular infiltrate. Electronically Signed   By: Elta [place]  Lukens M.D.   On: 05/19/2015 15:26    VASCULAR LAB PRELIMINARY PRELIMINARY PRELIMINARY PRELIMINARY  Right lower extremity duplex completed.   Preliminary report: No evidence of right groin pseudoaneurysm or A-V fistula Doppler signals are triphasic throughout the thigh    EKG: Independently reviewed. Vent. rate 92 BPM PR interval 123 ms QRS duration 79 ms QT/QTc 325/402 ms P-R-T axes 74 34 46 Sinus rhythm Lateral infarct, acute (LAD) ST elevation, consider inferior injury  Assessment/Plan Principal Problem:   Chest pain Most likely due to pericarditis. Admit to telemetry/observation. Continue cardiac monitoring. Continue analgesics as needed. Trend troponin levels. Check echocardiogram in a.m. Continue endocarditis IV antibiotic coverage for the moment. Cardiology is following.  Active Problems:   Pharyngitis due to group A beta hemolytic Streptococci Analgesics as needed. It should be covered by current IV antibiotics.    Cardiology is following Jettie Booze,  MD).    Code Status: Full code. DVT Prophylaxis: Lovenox SQ. Family Communication:  Disposition Plan: Admit to telemetry, trend troponin levels, echocardiogram.  Time spent: Over 70 minutes were spent in the process of this admission.  Reubin Milan, M.D. [facility] Pager [phone].

## 2015-05-19 NOTE — Consult Note (Signed)
CARDIOLOGY CONSULT NOTE      Patient ID: Darren Mccoy MRN: [id number] DOB/AGE: 07/03/1970 45 y.o.  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  He has had chest pain over the past week. He was in [place] and sought attention. He underwent cardiac catheterization. We do not have the report but per his report, there were no obstructive lesions. He was told everything was clear. He states he was given an antibiotic but he left a prescription in [place]. He was not told to take any over-the-counter medicines like Advil. In the chart here, the ER doctor is made a mention that there was a diagnosis of pericarditis.  Due to right groin pain, fever and overall not feeling well including chest discomfort, he came to the ER. He had a right groin ultrasound showing no pseudoaneurysm. He has been mildly febrile here. He continues to have some chest discomfort. ECG showed ST elevation diffusely. A call was made to the interventionalist who felt this was more likely pericarditis. We are now asked to evaluate him.  He currently has pain in his groin and pain up to his chest.    Review of systems complete and found to be negative unless listed above   Past Medical History  Diagnosis Date  . Asthma   . Chest pain     Evaluated  in Pulmonary clinic/ [facility]/ [place] / 02/23/2011    . Acute angina ([facility])     No family history on file.  Social History   Social History  . Marital Status: Legally Separated    Spouse Name: N/A  . Number of Children: 0  . Years of Education: N/A   Occupational History  . truck driver    Social History Main Topics  . Smoking status: Never Smoker   . Smokeless tobacco: Not on file  . Alcohol Use: No  . Drug Use: No  . Sexual Activity: Not on file   Other Topics Concern  . Not on file   Social History  Narrative    History reviewed. No pertinent past surgical history.    (Not in a hospital admission)  Physical Exam: Vitals:   Filed Vitals:   05/19/15 1436 05/19/15 1851  BP: 116/75 105/67  Pulse: 94 67  Temp: 100.9 F (38.3 C) 100.3 F (37.9 C)  TempSrc:  Oral  Resp: 20 17  SpO2: 100% 93%   I&O's:  No intake or output data in the 24 hours ending 05/19/15 1855 Physical exam: Ill-appearing [place]/AT EOMI No JVD, No carotid bruit RRR S1S2  No wheezing Soft. NT, nondistended No edema. No focal motor or sensory deficits Normal affect No obvious rash.   Labs:   Lab Results  Component Value Date   WBC 7.5 05/19/2015   HGB 13.8 05/19/2015   HCT 41.9 05/19/2015   MCV 90.1 05/19/2015   PLT 279 05/19/2015    Recent Labs Lab 05/19/15 1511  NA 138  K 4.1  CL 102  CO2 25  BUN 16  CREATININE 1.10  CALCIUM 9.1  GLUCOSE 90   Lab Results  Component Value Date   CKTOTAL 220 09/03/2010   CKMB 1.4 09/03/2010   TROPONINI <0.30 09/03/2010   No results found for: CHOL No results found for: HDL No results found for: LDLCALC No results found for: TRIG No results found for: CHOLHDL No results found for: LDLDIRECT  Radiology: EKG: Normal sinus rhythm with ST segment changes as noted above  ASSESSMENT AND PLAN:  Active Problems:   * No active hospital problems. *  Likely acute pericarditis. He also has tested positive for strep throat. He remains febrile. Will check blood cultures. No loud murmurs on exam that would make [place] think of endocarditis. He will need pain medication with anti-inflammatories such as Toradol. Watch renal function.  Follow blood cultures. Check echocardiogram to look at valvular function and to rule out pericardial effusion.  Hopefully, this will be a short stay in the hospital if his testing comes up negative.  Signed:   Mina Marble, MD, [facility] 05/19/2015, 6:55 PM

## 2015-05-19 NOTE — ED Notes (Signed)
Per pt, states he was hospitalized for angina and fever in [place]-had cardiac cath done-did not fill any of his scripts-flew home yesterday

## 2015-05-19 NOTE — ED Notes (Signed)
Sent add on slip

## 2015-05-20 ENCOUNTER — Telehealth: Payer: Self-pay

## 2015-05-20 ENCOUNTER — Observation Stay (HOSPITAL_BASED_OUTPATIENT_CLINIC_OR_DEPARTMENT_OTHER): Payer: Self-pay

## 2015-05-20 DIAGNOSIS — I309 Acute pericarditis, unspecified: Secondary | ICD-10-CM | POA: Diagnosis present

## 2015-05-20 DIAGNOSIS — R079 Chest pain, unspecified: Secondary | ICD-10-CM

## 2015-05-20 LAB — CBC WITH DIFFERENTIAL/PLATELET
BASOS PCT: 0 %
Basophils Absolute: 0 10*3/uL (ref 0.0–0.1)
EOS ABS: 0 10*3/uL (ref 0.0–0.7)
Eosinophils Relative: 1 %
HEMATOCRIT: 33.7 % — AB (ref 39.0–52.0)
HEMOGLOBIN: 11.7 g/dL — AB (ref 13.0–17.0)
LYMPHS ABS: 2.4 10*3/uL (ref 0.7–4.0)
Lymphocytes Relative: 32 %
MCH: 29.8 pg (ref 26.0–34.0)
MCHC: 34.7 g/dL (ref 30.0–36.0)
MCV: 86 fL (ref 78.0–100.0)
Monocytes Absolute: 1.4 10*3/uL — ABNORMAL HIGH (ref 0.1–1.0)
Monocytes Relative: 19 %
NEUTROS ABS: 3.7 10*3/uL (ref 1.7–7.7)
NEUTROS PCT: 48 %
Platelets: 299 10*3/uL (ref 150–400)
RBC: 3.92 MIL/uL — AB (ref 4.22–5.81)
RDW: 13 % (ref 11.5–15.5)
WBC: 7.5 10*3/uL (ref 4.0–10.5)

## 2015-05-20 LAB — TROPONIN I: Troponin I: <0.03 ng/mL (ref <0.031)

## 2015-05-20 LAB — COMPREHENSIVE METABOLIC PANEL
ALBUMIN: 2.9 g/dL — AB (ref 3.5–5.0)
ALT: 25 U/L (ref 17–63)
AST: 31 U/L (ref 15–41)
Alkaline Phosphatase: 48 U/L (ref 38–126)
Anion gap: 4 — ABNORMAL LOW (ref 5–15)
BUN: 11 mg/dL (ref 6–20)
CHLORIDE: 106 mmol/L (ref 101–111)
CO2: 27 mmol/L (ref 22–32)
Calcium: 8.1 mg/dL — ABNORMAL LOW (ref 8.9–10.3)
Creatinine, Ser: 1.01 mg/dL (ref 0.61–1.24)
GFR calc Af Amer: >60 mL/min (ref >60)
GFR calc non Af Amer: >60 mL/min (ref >60)
GLUCOSE: 97 mg/dL (ref 65–99)
Potassium: 4.3 mmol/L (ref 3.5–5.1)
Sodium: 137 mmol/L (ref 135–145)
Total Bilirubin: 0.7 mg/dL (ref 0.3–1.2)
Total Protein: 6.5 g/dL (ref 6.5–8.1)

## 2015-05-20 LAB — ECHOCARDIOGRAM COMPLETE
Height: 70 in
Weight: 2529.12 oz

## 2015-05-20 LAB — GC/CHLAMYDIA PROBE AMP (~~LOC~~) NOT AT ARMC
CHLAMYDIA, DNA PROBE: NEGATIVE
NEISSERIA GONORRHEA: NEGATIVE

## 2015-05-20 LAB — HIV ANTIBODY (ROUTINE TESTING W REFLEX): HIV Screen 4th Generation wRfx: NONREACTIVE

## 2015-05-20 LAB — HSV(HERPES SMPLX)ABS-I+II(IGG+IGM)-BLD
HSV 1 GLYCOPROTEIN G AB, IGG: 26.4 {index} — AB (ref 0.00–0.90)
HSV 2 Glycoprotein G Ab, IgG: <0.91 index (ref 0.00–0.90)

## 2015-05-20 LAB — INFLUENZA PANEL BY PCR (TYPE A & B)
H1N1 flu by pcr: NOT DETECTED
Influenza A By PCR: NEGATIVE
Influenza B By PCR: NEGATIVE

## 2015-05-20 MED ORDER — IBUPROFEN 200 MG PO TABS: ORAL_TABLET | ORAL | Status: DC

## 2015-05-20 MED ORDER — CARVEDILOL 3.125 MG PO TABS: 3.1250 mg | ORAL_TABLET | Freq: Two times a day (BID) | ORAL | Status: DC

## 2015-05-20 MED ORDER — LISINOPRIL 2.5 MG PO TABS: 2.5000 mg | ORAL_TABLET | Freq: Every day | ORAL | Status: DC

## 2015-05-20 MED ORDER — GUAIFENESIN-DM 100-10 MG/5ML PO SYRP: 5.0000 mL | ORAL_SOLUTION | ORAL | Status: DC | PRN

## 2015-05-20 MED ORDER — AMOXICILLIN 500 MG PO CAPS: 500.0000 mg | ORAL_CAPSULE | Freq: Two times a day (BID) | ORAL | Status: DC

## 2015-05-20 NOTE — Progress Notes (Signed)
*  PRELIMINARY RESULTS* Echocardiogram 2D Echocardiogram has been performed.  Darren Mccoy 05/20/2015, 9:56 AM

## 2015-05-20 NOTE — Telephone Encounter (Signed)
This Case Manager received communication from Purcell Mouton, RN CM that patient needing hospital follow-up appointment. Patient already discharged. Call placed to [phone] to schedule appointment.  Unable to reach patient; voicemail left requesting return call.

## 2015-05-20 NOTE — Progress Notes (Signed)
Completed D/C teaching with patient and family. Patient will be D/C home with family in stable condition.

## 2015-05-20 NOTE — Progress Notes (Signed)
Patient Name: Darren Mccoy Date of Encounter: 05/20/2015     Principal Problem:   Chest pain Active Problems:   Pharyngitis due to group A beta hemolytic Streptococci    SUBJECTIVE  CHest pain much improved on toradol No complaints.   CURRENT MEDS . enoxaparin (LOVENOX) injection  40 mg Subcutaneous Q24H  . ketorolac  30 mg Intravenous 3 times per day  . pantoprazole  40 mg Oral Daily  . sodium chloride flush  3 mL Intravenous Q12H  . sodium chloride flush  3 mL Intravenous Q12H    OBJECTIVE  Filed Vitals:   05/19/15 2030 05/19/15 2138 05/19/15 2215 05/20/15 0511  BP: 106/76 105/75 110/67 121/73  Pulse: 90 91 80 84  Temp:  99 F (37.2 C) 98.2 F (36.8 C) 98.9 F (37.2 C)  TempSrc:   Oral Oral  Resp: 18 19 20 20   Height:   5\' 10"  (1.778 m)   Weight:   158 lb 1.1 oz (71.7 kg)   SpO2: 100% 100% 100% 95%    Intake/Output Summary (Last 24 hours) at 05/20/15 0935 Last data filed at 05/20/15 0644  Gross per 24 hour  Intake 1529.59 ml  Output      0 ml  Net 1529.59 ml   Filed Weights   05/19/15 1952 05/19/15 2215  Weight: 167 lb (75.751 kg) 158 lb 1.1 oz (71.7 kg)    PHYSICAL EXAM  General: Pleasant, NAD. Neuro: Alert and oriented X 3. Moves all extremities spontaneously. Psych: Normal affect. HEENT:  Normal  Neck: Supple without bruits or JVD. Lungs:  Resp regular and unlabored, CTA. Heart: RRR no s3, s4, or murmurs. Abdomen: Soft, non-tender, non-distended, BS + x 4.  Extremities: No clubbing, cyanosis or edema. DP/PT/Radials 2+ and equal bilaterally.  Accessory Clinical Findings  CBC  Recent Labs  05/19/15 1511 05/20/15 0417  WBC 7.5 7.5  NEUTROABS  --  3.7  HGB 13.8 11.7*  HCT 41.9 33.7*  MCV 90.1 86.0  PLT 279 [id number]   Basic Metabolic Panel  Recent Labs  05/19/15 1511 05/20/15 0417  NA 138 137  K 4.1 4.3  CL 102 106  CO2 25 27  GLUCOSE 90 97  BUN 16 11  CREATININE 1.10 1.01  CALCIUM 9.1 8.1*   Liver Function Tests  Recent  Labs  05/20/15 0417  AST 31  ALT 25  ALKPHOS 48  BILITOT 0.7  PROT 6.5  ALBUMIN 2.9*   No results for input(s): LIPASE, AMYLASE in the last 72 hours. Cardiac Enzymes  Recent Labs  05/19/15 2248 05/20/15 0417  TROPONINI <0.03 <0.03     TELE  NS[facility]  Radiology/Studies  Dg Chest 2 View  05/19/2015  CLINICAL DATA:  Chest pain for 1 week EXAM: CHEST  2 VIEW COMPARISON:  04/02/2014 FINDINGS: Cardiac shadow is within normal limits. The lungs are well aerated bilaterally. Very minimal lingular infiltrate is seen. No sizable effusion is noted. No bony abnormality is seen. IMPRESSION: Mild lingular infiltrate. Electronically Signed   By: Inez Catalina M.D.   On: 05/19/2015 15:26    ASSESSMENT AND PLAN  Darren Mccoy is a 45 y.o. male with a history of mild intermittent asthma who is coming to the emergency department due to fever and chest pain since Thursday last week.  Acute pericarditis: recent cath in [place] with no CAD. ECG showed ST elevation diffusely. Troponin neg x2. Blood cultures pending but low suspicion for endocarditis. 2D ECHO pending to eval for pericardial effusion. Treating  pain with toradol. If echo okay, he will probably be okay to go home on antiinflammatories   Right groin pain: right groin ultrasound showing no pseudoaneurysm.  Strep throat: abx per IM  Signed, Eileen Stanford PA-C  Pager [phone]  I have examined the patient and reviewed assessment and plan and discussed with patient.  Agree with above as stated.  Feeling better.  Strep infection treated.  BC negative.  Await echo result.  I suspect he can go home today.  VARANASI,JAYADEEP S.

## 2015-05-20 NOTE — Discharge Summary (Addendum)
Discharge Summary  Elic Alberti [id number] DOB: 10-23-1970  PCP: No PCP, referral given to community and [place] date: 05/19/2015 Discharge date: 05/20/2015  Time spent: 25 minutes   Recommendations for Outpatient Follow-up:  1. New medication: When necessary Robitussin  2. New medication: Motrin 400 mg every 8 hours 4 days scheduled 3. New medication: Amoxicillin 500 by mouth twice a day 10 days  Discharge Diagnoses:  Active Hospital Problems   Diagnosis Date Noted  . Chest pain 02/23/2011  . Chronic diastolic heart failure ([place]) 05/20/2015  . Pharyngitis due to group A beta hemolytic Streptococci Pericarditis  05/19/2015    Resolved Hospital Problems   Diagnosis Date Noted Date Resolved  No resolved problems to display.    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 Discharged out.   Since then, has been extending up her respiratory symptoms including sore throat as well as generalized body aches. He also had right groin pain. Came to the emergency room here where Her ultrasound noted no evidence of pseudoaneurysm. Workup in the emergency room also revealed positive pharyngeal swab for group A strep as well as microscopic hematuria. Renal function normal. Patient admitted to hospitalist service. EKG done on admission noted diffuse ST elevations across the board consistent with pericarditis  Hospital Course:  Principal Problem:   Chest pain with suspected pericarditis: Enzymes 3 normal. EKG noted some diffuse ST  elevations, however 2-D echo done noted no evidence of pericardial effusion. No evidence of endocarditis. Patient started to feel better.  Discharge home with several days of scheduled NSAIDs. Of note, grade 1 diastolic dysfunction noted on echocardiogram, however given this in the context of his pericarditis, do not feel that this is accurate. No evidence of any other cardiac issues. Blood pressure stable. At this time would not start ACE inhibitor or beta blocker. Active Problems:   Pharyngitis due to group A beta hemolytic Streptococci: Amoxicillin 500 mg by mouth twice a day 10 days    Procedures:  Echocardiogram: Grade 1 diastolic dysfunction   Consultations:  Cardiology   Discharge Exam: BP 120/99 mmHg  Pulse 88  Temp(Src) 98.8 F (37.1 C) (Oral)  Resp 18  Ht 5\' 10"  (1.778 m)  Wt 71.7 kg (158 lb 1.1 oz)  BMI 22.68 kg/m2  SpO2 100%  General: Alert and oriented 3, no acute distress  Cardiovascular: Regular rate and rhythm, S1-S2  Respiratory: Clear to auscultation bilaterally   Discharge Instructions You were cared for by a hospitalist during your hospital stay. If you have any questions about your discharge medications or the care you received while you were in the hospital after you are discharged, you can call the unit and asked to speak with the hospitalist on call if the hospitalist that took care of you is not available. Once you are discharged, your primary care physician will handle any further medical issues. Please note that NO REFILLS for any discharge medications will be authorized once you are discharged, as it is imperative that you return to your primary  care physician (or establish a relationship with a primary care physician if you do not have one) for your aftercare needs so that they can reassess your need for medications and monitor your lab values.  Discharge Instructions    Diet - low sodium heart healthy    Complete by:  As directed      Increase  activity slowly    Complete by:  As directed             Medication List    TAKE these medications        acetaminophen 325 MG tablet  Commonly known as:  TYLENOL  Take 650 mg by mouth daily as needed for mild pain or moderate pain.     amoxicillin 500 MG capsule  Commonly known as:  AMOXIL  Take 1 capsule (500 mg total) by mouth 2 (two) times daily.     carvedilol 3.125 MG tablet  Commonly known as:  COREG  Take 1 tablet (3.125 mg total) by mouth 2 (two) times daily with a meal.     guaiFENesin-dextromethorphan 100-10 MG/5ML syrup  Commonly known as:  ROBITUSSIN DM  Take 5 mLs by mouth every 4 (four) hours as needed for cough.     ibuprofen 200 MG tablet  Commonly known as:  ADVIL,MOTRIN  Take 400 mg by mouth every 6 (six) hours as needed for headache or moderate pain.     lisinopril 2.5 MG tablet  Commonly known as:  PRINIVIL,ZESTRIL  Take 1 tablet (2.5 mg total) by mouth daily.       No Known Allergies    The results of significant diagnostics from this hospitalization (including imaging, microbiology, ancillary and laboratory) are listed below for reference.    Significant Diagnostic Studies: Dg Chest 2 View  05/19/2015  CLINICAL DATA:  Chest pain for 1 week EXAM: CHEST  2 VIEW COMPARISON:  04/02/2014 FINDINGS: Cardiac shadow is within normal limits. The lungs are well aerated bilaterally. Very minimal lingular infiltrate is seen. No sizable effusion is noted. No bony abnormality is seen. IMPRESSION: Mild lingular infiltrate. Electronically Signed   By: Inez Catalina M.D.   On: 05/19/2015 15:26    Microbiology: Recent Results (from the past 240 hour(s))  Rapid strep screen     Status: Abnormal   Collection Time: 05/19/15  4:15 PM  Result Value Ref Range Status   Streptococcus, Group A Screen (Direct) POSITIVE (A) NEGATIVE Final  Culture, blood (routine x 2)     Status: None (Preliminary result)   Collection Time: 05/19/15  7:25 PM  Result Value Ref Range  Status   Specimen Description BLOOD RIGHT HAND  Final   Special Requests BOTTLES DRAWN AEROBIC ONLY 5CC  Final   Culture   Final    NO GROWTH < 24 HOURS Performed at [facility]    Report Status PENDING  Incomplete     Labs: Basic Metabolic Panel:  Recent Labs Lab 05/19/15 1511 05/20/15 0417  NA 138 137  K 4.1 4.3  CL 102 106  CO2 25 27  GLUCOSE 90 97  BUN 16 11  CREATININE 1.10 1.01  CALCIUM 9.1 8.1*   Liver Function Tests:  Recent Labs Lab 05/20/15 0417  AST 31  ALT 25  ALKPHOS 48  BILITOT 0.7  PROT 6.5  ALBUMIN 2.9*   No results for input(s): LIPASE, AMYLASE in the last 168 hours. No results for input(s): AMMONIA in the last 168 hours. CBC:  Recent Labs Lab 05/19/15  1511 05/20/15 0417  WBC 7.5 7.5  NEUTROABS  --  3.7  HGB 13.8 11.7*  HCT 41.9 33.7*  MCV 90.1 86.0  PLT 279 299   Cardiac Enzymes:  Recent Labs Lab 05/19/15 2248 05/20/15 0417  TROPONINI <0.03 <0.03   BNP: BNP (last 3 results) No results for input(s): BNP in the last 8760 hours.  ProBNP (last 3 results) No results for input(s): PROBNP in the last 8760 hours.  CBG: No results for input(s): GLUCAP in the last 168 hours.     Signed:  Annita Brod  [facility] 05/20/2015, 3:35 PM

## 2015-05-20 NOTE — Progress Notes (Signed)
Pt will need to call for an appointment with [facility] related to no appointments are available at present time. [facility]. Pt will need to call to make his appointment.

## 2015-05-21 LAB — RPR: RPR Ser Ql: NONREACTIVE

## 2015-05-21 LAB — ANTISTREPTOLYSIN O TITER: ASO: 37 [IU]/mL (ref 0.0–200.0)

## 2015-05-24 LAB — CULTURE, BLOOD (ROUTINE X 2): Culture: NO GROWTH

## 2015-05-25 LAB — CULTURE, BLOOD (ROUTINE X 2): Culture: NO GROWTH

## 2015-09-21 ENCOUNTER — Ambulatory Visit (INDEPENDENT_AMBULATORY_CARE_PROVIDER_SITE_OTHER): Payer: BLUE CROSS/BLUE SHIELD | Admitting: Physician Assistant

## 2015-09-21 VITALS — BP 114/82 | HR 66 | Temp 98.2°F | Resp 16 | Ht 70.5 in | Wt 162.0 lb

## 2015-09-21 DIAGNOSIS — Z23 Encounter for immunization: Secondary | ICD-10-CM

## 2015-09-21 DIAGNOSIS — Z7189 Other specified counseling: Secondary | ICD-10-CM

## 2015-09-21 DIAGNOSIS — D179 Benign lipomatous neoplasm, unspecified: Secondary | ICD-10-CM

## 2015-09-21 DIAGNOSIS — Z7184 Encounter for health counseling related to travel: Secondary | ICD-10-CM

## 2015-09-21 DIAGNOSIS — L989 Disorder of the skin and subcutaneous tissue, unspecified: Secondary | ICD-10-CM | POA: Diagnosis not present

## 2015-09-21 MED ORDER — TYPHOID VACCINE PO CPDR: 1.0000 | DELAYED_RELEASE_CAPSULE | ORAL | 0 refills | Status: AC

## 2015-09-21 MED ORDER — DOXYCYCLINE HYCLATE 100 MG PO CAPS: 100.0000 mg | ORAL_CAPSULE | Freq: Every day | ORAL | 0 refills | Status: AC

## 2015-09-21 NOTE — Progress Notes (Signed)
[facility] [address] [place] [address] [phone]  Date:  09/21/2015   Name:  Darren Mccoy   DOB:  [birth year]   MRN:  [id number]  PCP:  Default, Provider, MD   Chief Complaint  Patient presents with  . Immunizations    patient is about to travel out of the country and is needed an MMR and Menvo     History of Present Illness:  Darren Mccoy is a 45 y.o. male patient who presents to [facility] for immunizations.   --patient will be going to [place] for 1 month.  He will be leaving in 1 month.  --He states that he was told to get MMR and meningococcal.  He is unsure of his immunizations.  He has not had any in [place].    --other complaint of fatty growth at the back of his arm.  It started very miniscule about 2 weeks ago, but has grown since.  It is tender at times when he touches it.  No pain at this time or warmth.      Patient Active Problem List   Diagnosis Date Noted  . Acute pericarditis 05/20/2015  . Pharyngitis due to group A beta hemolytic Streptococci 05/19/2015  . BPH (benign prostatic hyperplasia) 05/04/2014  . Chest pain 02/23/2011    Past Medical History:  Diagnosis Date  . Acute angina ([place])   . Asthma   . Chest pain    Evaluated  in Pulmonary clinic/ [facility]/ [place] / 02/23/2011      History reviewed. No pertinent surgical history.  Social History  Substance Use Topics  . Smoking status: Never Smoker  . Smokeless tobacco: Never Used  . Alcohol use No    History reviewed. No pertinent family history.  No Known Allergies  Medication list has been reviewed and updated.  No current outpatient prescriptions on file prior to visit.   No current facility-administered medications on file prior to visit.     ROS ROS otherwise unremarkable unless listed above.   Physical Examination: BP 114/82   Pulse 66   Temp 98.2 F (36.8 C) (Oral)   Resp 16   Ht 5' 10.5" (1.791 m)   Wt 162 lb (73.5 kg)   SpO2 98%   BMI 22.92  kg/m  Ideal Body Weight: Weight in (lb) to have BMI = 25: 176.4  Physical Exam  Constitutional: He is oriented to person, place, and time. He appears well-developed and well-nourished. No distress.  HENT:  Head: Normocephalic and atraumatic.  Eyes: Conjunctivae and EOM are normal. Pupils are equal, round, and reactive to light.  Cardiovascular: Normal rate.   Pulmonary/Chest: Effort normal. No respiratory distress.  Neurological: He is alert and oriented to person, place, and time.  Skin: Skin is warm and dry. He is not diaphoretic.  Mobile fatty growth at the posterior forearm that is non-tender.  No change in skin texture.    Psychiatric: He has a normal mood and affect. His behavior is normal.     Assessment and Plan: Darren Mccoy is a 45 y.o. male who is here today for immunizations. Advised to have tdap today, menveo, and MMR (it appears to have had 1st dose).  I recommended an MMR titer, but he is declining.  Tdap today. Also given typhoid vaccination med and advised to take now.  Discussed malaria chemoprophylaxis to start just prior to trip and med management. Informed that he should contact [facility] immediately for immunization of yellow  fever as required.  He voiced understanding.  Will attempt to get general surgery involved for fatty growth, as this has become painful for him.  25 minutes spent discussing travel vaccinations and immunizations, directly care given.   Need for prophylactic vaccination and inoculation against viral hepatitis - Plan: Hepatitis A vaccine adult IM, Care order/instruction:  Need for Tdap vaccination - Plan: Tdap vaccine greater than or equal to 7yo IM, Care order/instruction:  Need for meningococcal vaccination - Plan: Meningococcal conjugate vaccine 4-valent IM, Care order/instruction:  Need for MMR vaccine - Plan: MMR vaccine subcutaneous, Care order/instruction:  Travel advice encounter - Plan: doxycycline (VIBRAMYCIN) 100 MG capsule, Care  order/instruction:  Need for immunization against typhoid - Plan: typhoid (VIVOTIF) DR capsule, Care order/instruction:  Skin lesion of left arm - Plan: Ambulatory referral to General Surgery, Care order/instruction:  Lipoma - Plan: Ambulatory referral to General Surgery, Care order/instruction:   Ivar Drape, PA-C [facility] 09/21/2015 9:27 AM

## 2015-09-21 NOTE — Patient Instructions (Addendum)
IF you received an x-ray today, you will receive an invoice from [facility]. Please contact [facility] at [phone] with questions or concerns regarding your invoice.   IF you received labwork today, you will receive an invoice from [employer]. Please contact [facility] at [phone] with questions or concerns regarding your invoice.   Our billing staff will not be able to assist you with questions regarding bills from these companies.  You will be contacted with the lab results as soon as they are available. The fastest way to get your results is to activate your My Chart account. Instructions are located on the last page of this paperwork. If you have not heard from [place] regarding the results in 2 weeks, please contact this office.    Take the vivotif now, so that you can take the doxycycline later, near the trip. I would like you to contact the [facility] Department for yellow fever vaccine.   You are going to take the doxycycine starting 2 days prior to your trip.  Take during the trip, then take for 1 month after the trip. Immunization Information for Foreign Travel Immunizations can protect you from certain diseases. Immunizations can also prevent the spread of certain infections. It is important to see your caregiver or a travel medicine specialist 4-6 weeks before you travel. This allows time for vaccines to take effect. It also provides enough time for you to get vaccines that must be given in a series over a period of days or weeks. Immunizations for travelers include:  Routine vaccines. These vaccines are standard for the people in a country.  Recommended vaccines. These vaccines are recommended before travel to some countries or regions.  Required vaccines. These vaccines are necessary before travel to specific countries or regions. If it is less than 4 weeks before you leave, you should still see your caregiver. You  might still benefit from vaccines or medicines. WHAT ARE THE ROUTINE VACCINES? Routine vaccines can protect you from diseases that are common in many parts of the world. Most routine vaccines are given at specific ages during your life. However, routine vaccines also include the annual flu (influenza) vaccine. You should be up to date on your routine immunizations before you travel. Your caregiver will be able to review your vaccine history and determine whether you have had all the routine vaccines. You may be advised to get extra doses or booster vaccines even if you are up to date on the routine vaccines. WHAT ARE THE RECOMMENDED VACCINES? Know your travel schedule when you visit your caregiver. The vaccines recommended before foreign travel will depend on several factors, including:  The country or countries of travel.  Whether you will travel to rural areas.  The length of time you will be traveling.  The season of the year.  Your age.  Your health status.  Your previous immunizations. Vaccine recommendations change over time. Your caregiver can tell you what vaccines are recommended before your trip. The annual influenza vaccine sometimes differs for the [place] and [place] hemispheres. Unless the annual vaccines are the same in both hemispheres, people with certain chronic medical conditions who are traveling to the other hemisphere shortly before or during the influenza season should also get the other influenza vaccine. The other influenza vaccine should be obtained either before leaving the country or shortly after arrival at the travel site. WHAT ARE THE REQUIRED VACCINES? Vaccines may be required during a current outbreak of  an infectious disease in a country or region. Your caregiver will be able to tell you about any current outbreaks and required vaccines. For example, proof of yellow fever immunization is currently required for most people before traveling to certain  countries in [place] and [place]. This vaccine can only be obtained at approved centers. You should get the yellow fever vaccine at least 10 days before your trip. After 10 days, most people show immunity to yellow fever. If it has been longer than 10 years since you received the yellow fever vaccine, another dose is required. If proof of immunization is incomplete or inaccurate, you could be quarantined, denied entry, or given another dose of vaccine at the travel site. If you cannot receive the yellow fever vaccine because of medical reasons, you must have a written statement from your caregiver. The statement must contain a medical reason for the lack of immunization. In such a case, your caregiver should then give you advice on how to decrease your chance of getting yellow fever. That advice should include taking precautions to avoid mosquito bites and limiting outdoor time. Other than having a medical condition or being under the age of 18 months, no other reasons will be accepted for not getting the vaccine.  Proof of meningococcal immunization is required by the [place] for any person older than 2 years who is taking part in the [place] or [place]. Visas for traveling to the [place] or [place] will not even be issued until there is proof of immunization. You should get this vaccine at least 10 days before your trip. After 10 days, most people show immunity. If it has been longer than 3 years since your last immunization, another dose is required. FOR MORE INFORMATION  Centers for Disease Control and Prevention (CDC): http://www.wolf.info/  World Health Organization [facility]): RoleLink.com.br   This information is not intended to replace advice given to you by your health care provider. Make sure you discuss any questions you have with your health care provider.   Document Released: 02/01/2009 Document Revised: 03/06/2014 Document Reviewed: 01/12/2012 Elsevier Interactive Patient Education [employer]

## 2016-06-05 ENCOUNTER — Other Ambulatory Visit: Payer: Self-pay | Admitting: General Surgery

## 2016-06-17 ENCOUNTER — Encounter (HOSPITAL_COMMUNITY): Payer: Self-pay | Admitting: *Deleted

## 2016-06-17 ENCOUNTER — Emergency Department (HOSPITAL_COMMUNITY)
Admission: EM | Admit: 2016-06-17 | Discharge: 2016-06-17 | Disposition: A | Discharge status: Home / Self Care | Payer: BLUE CROSS/BLUE SHIELD | Attending: Emergency Medicine | Admitting: Emergency Medicine

## 2016-06-17 DIAGNOSIS — J111 Influenza due to unidentified influenza virus with other respiratory manifestations: Secondary | ICD-10-CM | POA: Diagnosis not present

## 2016-06-17 DIAGNOSIS — R05 Cough: Secondary | ICD-10-CM | POA: Diagnosis present

## 2016-06-17 DIAGNOSIS — Z79899 Other long term (current) drug therapy: Secondary | ICD-10-CM | POA: Diagnosis not present

## 2016-06-17 DIAGNOSIS — R69 Illness, unspecified: Secondary | ICD-10-CM

## 2016-06-17 DIAGNOSIS — J45909 Unspecified asthma, uncomplicated: Secondary | ICD-10-CM | POA: Diagnosis not present

## 2016-06-17 MED ORDER — BENZONATATE 100 MG PO CAPS: 100.0000 mg | ORAL_CAPSULE | Freq: Three times a day (TID) | ORAL | 0 refills | Status: DC

## 2016-06-17 NOTE — ED Triage Notes (Signed)
Pt reports recent non productive cough, fever, headache with nausea. No acute distress noted at triage.

## 2016-06-17 NOTE — ED Notes (Signed)
Got patient ready for discharge 

## 2016-06-17 NOTE — Discharge Instructions (Signed)
Take tylenol 2 pills 4 times a day and motrin 4 pills 3 times a day.  Drink plenty of fluids.  Return for worsening shortness of breath, sudden worsening headache, confusion. Follow up with your family doctor.

## 2016-06-17 NOTE — ED Provider Notes (Signed)
[facility] DEPT Provider Note   CSN: [id number] Arrival date & time: 06/17/16  1256     History   Chief Complaint Chief Complaint  Patient presents with  . Cough  . Headache    HPI Darren Mccoy is a 46 y.o. male.  46 yo M with a cc of cough, congestion and a headache. The headache is been an ongoing issue for the patient. Nothing seems to make it better or worse. Not worse with coughing. No issues with speech numbness ambulation. Patient is most worried at night when his cough is worsening feels like he has to breathe faster than normal. Denies fevers. Has been having some diffuse myalgias.   The history is provided by the patient.  Illness  This is a new problem. The current episode started yesterday. The problem occurs constantly. The problem has not changed since onset.Associated symptoms include headaches. Pertinent negatives include no chest pain, no abdominal pain and no shortness of breath. Nothing aggravates the symptoms. Nothing relieves the symptoms. He has tried nothing for the symptoms. The treatment provided no relief.    Past Medical History:  Diagnosis Date  . Acute angina ([place])   . Asthma   . Chest pain    Evaluated  in Pulmonary clinic/ [facility]/ [place] / 02/23/2011      Patient Active Problem List   Diagnosis Date Noted  . Acute pericarditis 05/20/2015  . Pharyngitis due to group A beta hemolytic Streptococci 05/19/2015  . BPH (benign prostatic hyperplasia) 05/04/2014  . Chest pain 02/23/2011    History reviewed. No pertinent surgical history.     Home Medications    Prior to Admission medications   Medication Sig Start Date End Date Taking? Authorizing Provider  benzonatate (TESSALON) 100 MG capsule Take 1 capsule (100 mg total) by mouth every 8 (eight) hours. 06/17/16   Deno Etienne, DO  typhoid (VIVOTIF) DR capsule Take 1 capsule by mouth every other day. X 4 doses. 09/21/15   Joretta Bachelor, PA    Family History History  reviewed. No pertinent family history.  Social History Social History  Substance Use Topics  . Smoking status: Never Smoker  . Smokeless tobacco: Never Used  . Alcohol use No     Allergies   Patient has no known allergies.   Review of Systems Review of Systems  Constitutional: Negative for chills and fever.  HENT: Positive for congestion. Negative for facial swelling.   Eyes: Negative for discharge and visual disturbance.  Respiratory: Positive for cough. Negative for shortness of breath.   Cardiovascular: Negative for chest pain and palpitations.  Gastrointestinal: Negative for abdominal pain, diarrhea and vomiting.  Musculoskeletal: Negative for arthralgias and myalgias.  Skin: Negative for color change and rash.  Neurological: Positive for headaches. Negative for tremors and syncope.  Psychiatric/Behavioral: Negative for confusion and dysphoric mood.     Physical Exam Updated Vital Signs BP 124/80 (BP Location: Left Arm)   Pulse 86   Temp 98.1 F (36.7 C) (Oral)   Resp 14   SpO2 98%   Physical Exam  Constitutional: He is oriented to person, place, and time. He appears well-developed and well-nourished.  HENT:  Head: Normocephalic and atraumatic.  Swollen turbinates, posterior nasal drip, no noted sinus ttp, tm normal bilaterally.    Eyes: EOM are normal. Pupils are equal, round, and reactive to light.  Neck: Normal range of motion. Neck supple. No JVD present.  Cardiovascular: Normal rate and regular rhythm.  Exam reveals no gallop  and no friction rub.   No murmur heard. Pulmonary/Chest: No respiratory distress. He has no wheezes.  Abdominal: He exhibits no distension and no mass. There is no tenderness. There is no rebound and no guarding.  Musculoskeletal: Normal range of motion.  Neurological: He is alert and oriented to person, place, and time.  Skin: No rash noted. No pallor.  Psychiatric: He has a normal mood and affect. His behavior is normal.  Nursing  note and vitals reviewed.    ED Treatments / Results  Labs (all labs ordered are listed, but only abnormal results are displayed) Labs Reviewed - No data to display  EKG  EKG Interpretation None       Radiology No results found.  Procedures Procedures (including critical care time)  Medications Ordered in ED Medications - No data to display   Initial Impression / Assessment and Plan / ED Course  I have reviewed the triage vital signs and the nursing notes.  Pertinent labs & imaging results that were available during my care of the patient were reviewed by me and considered in my medical decision making (see chart for details).     46 yo M With a chief complaint of an influenza-like illness. Going on for the past couple days. Patient also happens to have a chronic headache. This been going on for quite some time for the patient. His left-sided radiates to the right side of his head. Since this is been ongoing for so long will have him follow-up with the neurologist. No bacterial source of infection found on exam. I doubt that this is meningitis.  2:22 PM:  I have discussed the diagnosis/risks/treatment options with the patient and believe the pt to be eligible for discharge home to follow-up with PCP, neuro. We also discussed returning to the ED immediately if new or worsening sx occur. We discussed the sx which are most concerning (e.g., sudden worsening pain, fever, inability to tolerate by mouth) that necessitate immediate return. Medications administered to the patient during their visit and any new prescriptions provided to the patient are listed below.  Medications given during this visit Medications - No data to display   The patient appears reasonably screen and/or stabilized for discharge and I doubt any other medical condition or other [facility] requiring further screening, evaluation, or treatment in the ED at this time prior to discharge.    Final Clinical  Impressions(s) / ED Diagnoses   Final diagnoses:  Influenza-like illness    New Prescriptions New Prescriptions   BENZONATATE (TESSALON) 100 MG CAPSULE    Take 1 capsule (100 mg total) by mouth every 8 (eight) hours.     Deno Etienne, DO 06/17/16 1422

## 2016-06-19 ENCOUNTER — Encounter: Payer: Self-pay | Admitting: Neurology

## 2016-07-14 ENCOUNTER — Ambulatory Visit: Payer: BLUE CROSS/BLUE SHIELD | Admitting: Neurology

## 2016-09-27 ENCOUNTER — Ambulatory Visit: Payer: BLUE CROSS/BLUE SHIELD | Admitting: Neurology

## 2016-11-07 IMAGING — CR DG CHEST 2V
2 series · 2 of 2 positions shown · non-contrast
Comparison: 04/26/2012

CLINICAL DATA: Chest pain and shortness of breath.

EXAM:
CHEST  2 VIEW

[w chest pa]
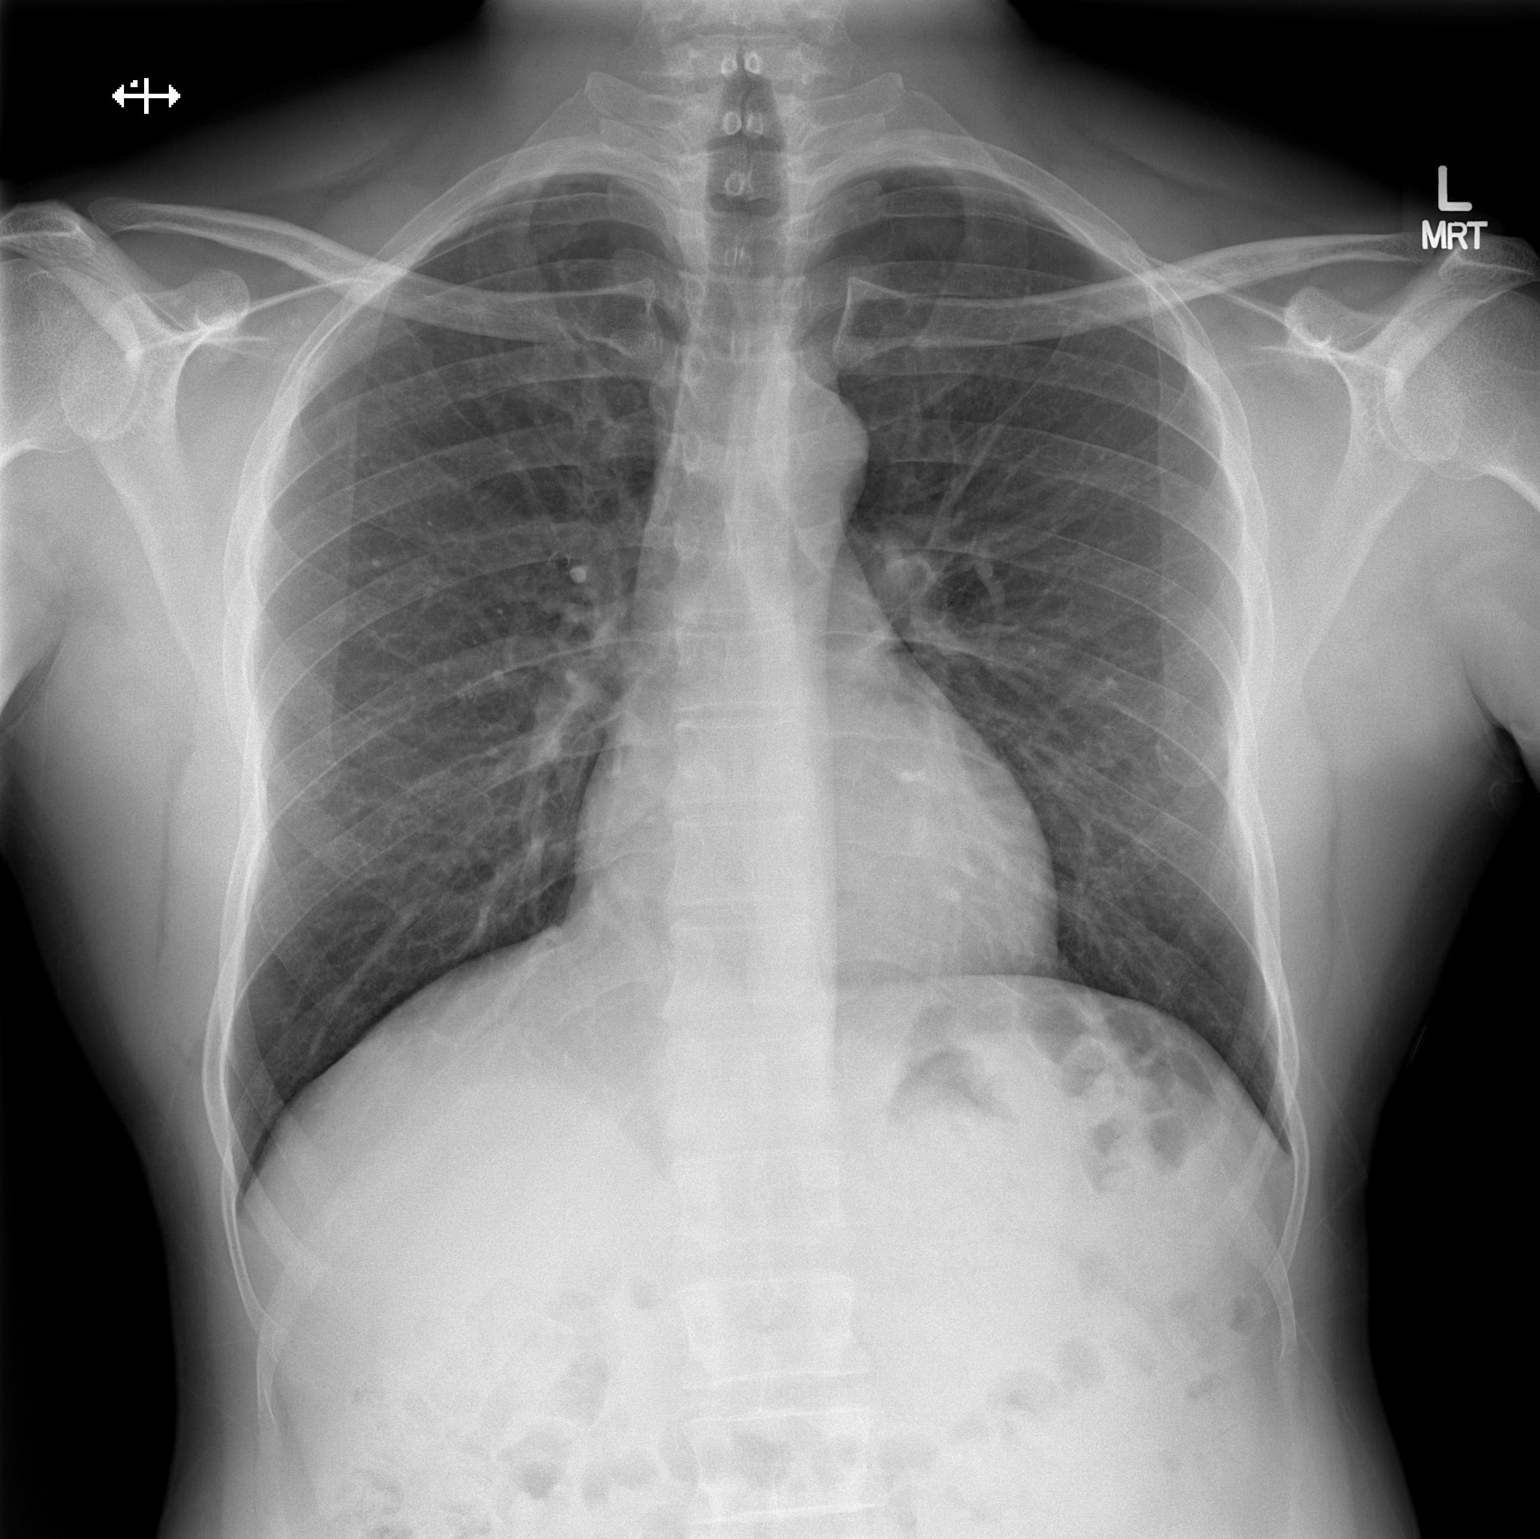

[w chest lat]
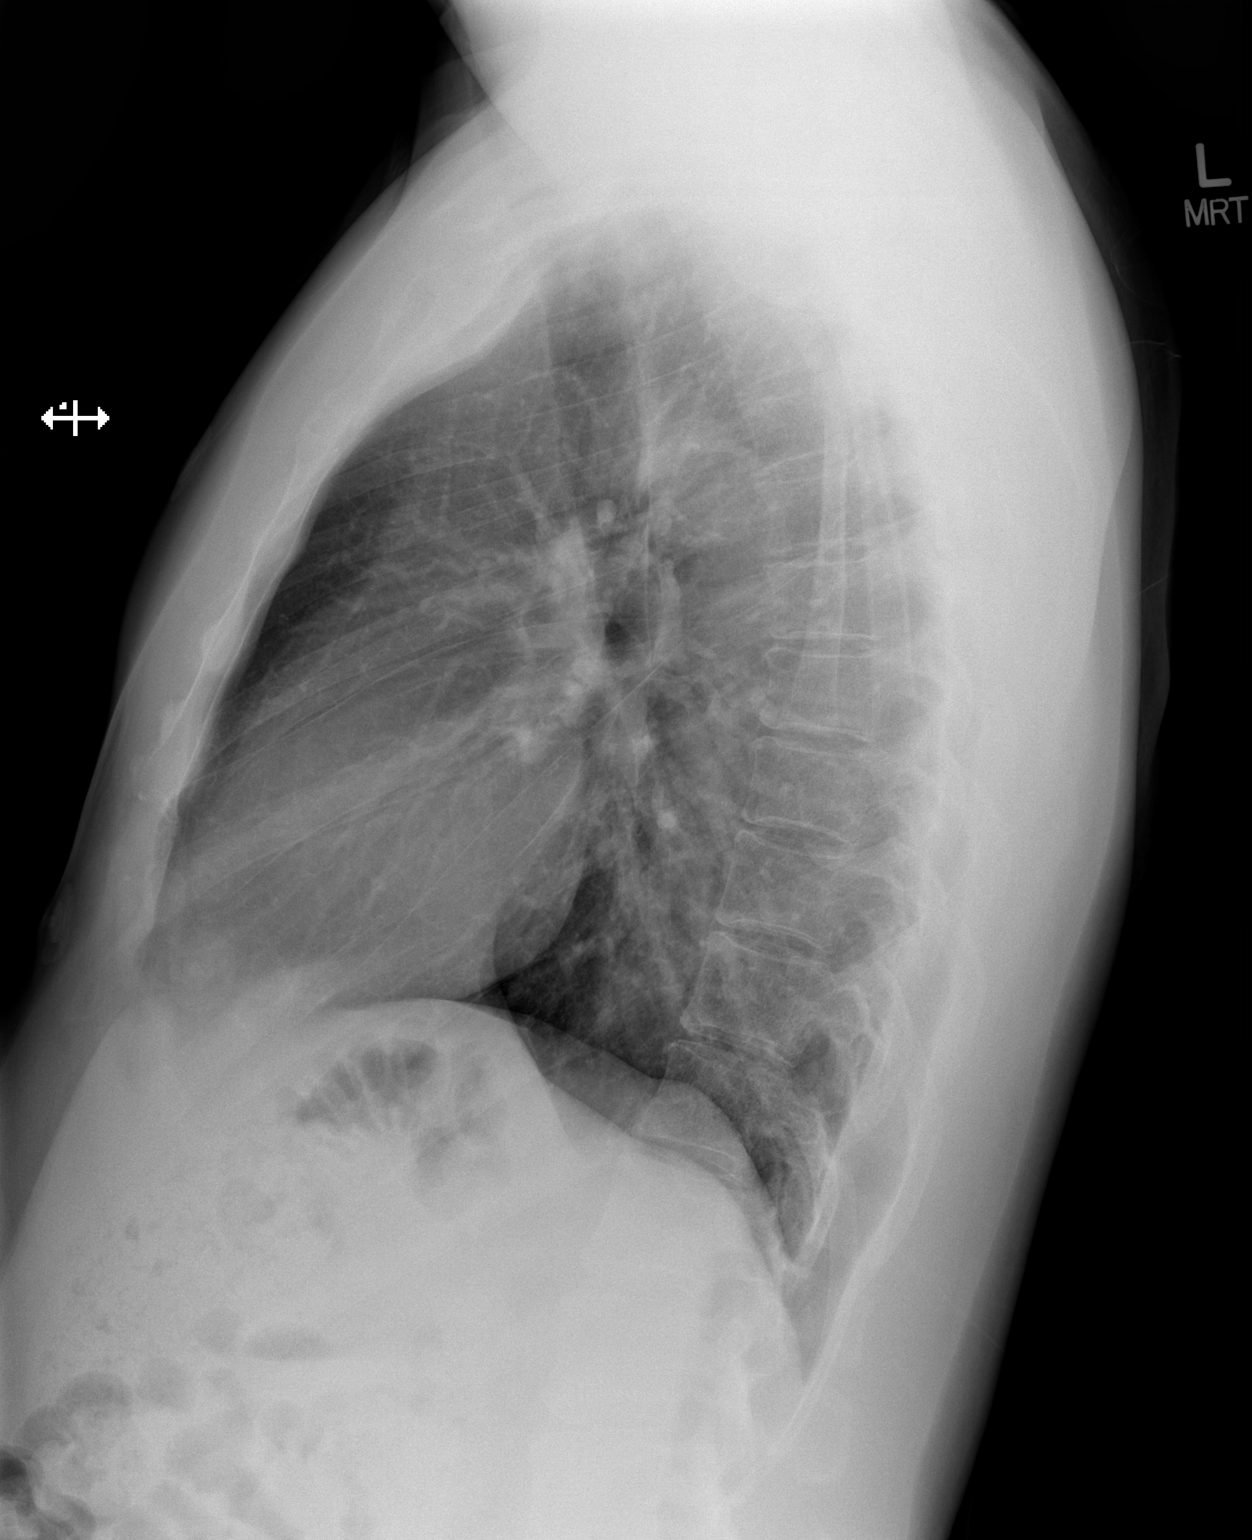

[2 of 2 positions shown; findings below may reference images not displayed]

FINDINGS: The heart size and mediastinal contours are within normal limits.
Both lungs are clear. The visualized skeletal structures are
unremarkable.
IMPRESSION: No active cardiopulmonary disease.

## 2016-11-16 ENCOUNTER — Encounter: Payer: Self-pay | Admitting: Physician Assistant

## 2016-11-16 DIAGNOSIS — D1722 Benign lipomatous neoplasm of skin and subcutaneous tissue of left arm: Secondary | ICD-10-CM | POA: Insufficient documentation

## 2017-06-06 ENCOUNTER — Encounter: Payer: Self-pay | Admitting: Physician Assistant

## 2019-04-02 ENCOUNTER — Ambulatory Visit: Payer: BLUE CROSS/BLUE SHIELD | Attending: Internal Medicine

## 2019-04-02 DIAGNOSIS — Z20822 Contact with and (suspected) exposure to covid-19: Secondary | ICD-10-CM

## 2019-04-03 ENCOUNTER — Ambulatory Visit: Payer: BLUE CROSS/BLUE SHIELD | Attending: Internal Medicine

## 2019-04-03 DIAGNOSIS — Z20822 Contact with and (suspected) exposure to covid-19: Secondary | ICD-10-CM

## 2019-04-03 LAB — NOVEL CORONAVIRUS, NAA: SARS-CoV-2, NAA: NOT DETECTED

## 2019-04-04 ENCOUNTER — Ambulatory Visit: Payer: BLUE CROSS/BLUE SHIELD | Attending: Internal Medicine

## 2019-04-04 DIAGNOSIS — Z20822 Contact with and (suspected) exposure to covid-19: Secondary | ICD-10-CM

## 2019-04-04 LAB — NOVEL CORONAVIRUS, NAA: SARS-CoV-2, NAA: NOT DETECTED

## 2019-04-06 LAB — NOVEL CORONAVIRUS, NAA: SARS-CoV-2, NAA: NOT DETECTED

## 2019-05-26 ENCOUNTER — Ambulatory Visit: Payer: BLUE CROSS/BLUE SHIELD | Attending: Internal Medicine

## 2019-08-05 ENCOUNTER — Other Ambulatory Visit (HOSPITAL_COMMUNITY): Payer: BLUE CROSS/BLUE SHIELD

## 2020-05-17 ENCOUNTER — Ambulatory Visit: Payer: BLUE CROSS/BLUE SHIELD | Attending: Critical Care Medicine

## 2020-05-17 DIAGNOSIS — Z20822 Contact with and (suspected) exposure to covid-19: Secondary | ICD-10-CM

## 2020-05-18 LAB — SARS-COV-2, NAA 2 DAY TAT

## 2020-05-18 LAB — NOVEL CORONAVIRUS, NAA: SARS-CoV-2, NAA: NOT DETECTED

## 2021-03-24 ENCOUNTER — Ambulatory Visit: Payer: BLUE CROSS/BLUE SHIELD

## 2021-03-25 ENCOUNTER — Ambulatory Visit: Payer: BLUE CROSS/BLUE SHIELD

## 2021-03-30 ENCOUNTER — Ambulatory Visit: Payer: BLUE CROSS/BLUE SHIELD | Attending: Internal Medicine

## 2021-03-30 ENCOUNTER — Other Ambulatory Visit (HOSPITAL_BASED_OUTPATIENT_CLINIC_OR_DEPARTMENT_OTHER): Payer: Self-pay

## 2021-03-30 DIAGNOSIS — Z23 Encounter for immunization: Secondary | ICD-10-CM

## 2021-03-30 MED ORDER — PFIZER COVID-19 VAC BIVALENT 30 MCG/0.3ML IM SUSP
INTRAMUSCULAR | 0 refills | Status: AC
  Filled 2021-03-30: qty 0.3, 1d supply, fill #0

## 2021-03-30 NOTE — Progress Notes (Signed)
° °  Covid-19 Vaccination Clinic  Name:  Darren Mccoy    MRN: [id number] DOB: 02/24/1971  03/30/2021  [name] was observed post Covid-19 immunization for 15 minutes without incident. He was provided with Vaccine Information Sheet and instruction to access the V-Safe system.   [name] was instructed to call 911 with any severe reactions post vaccine: Difficulty breathing  Swelling of face and throat  A fast heartbeat  A bad rash all over body  Dizziness and weakness   Immunizations Administered     Name Date Dose VIS Date Route   Pfizer Covid-19 Vaccine Bivalent Booster 03/30/2021 10:30 AM 0.3 mL 10/27/2020 Intramuscular   Manufacturer: [place]   Lot: [id number]   [place]: [phone]

## 2021-05-29 ENCOUNTER — Emergency Department (HOSPITAL_BASED_OUTPATIENT_CLINIC_OR_DEPARTMENT_OTHER)
Admission: EM | Admit: 2021-05-29 | Discharge: 2021-05-29 | Disposition: A | Discharge status: Home / Self Care | Payer: 59 | Attending: Emergency Medicine | Admitting: Emergency Medicine

## 2021-05-29 ENCOUNTER — Other Ambulatory Visit: Payer: Self-pay

## 2021-05-29 ENCOUNTER — Encounter: Payer: Self-pay | Admitting: Emergency Medicine

## 2021-05-29 ENCOUNTER — Emergency Department (HOSPITAL_BASED_OUTPATIENT_CLINIC_OR_DEPARTMENT_OTHER): Payer: 59

## 2021-05-29 ENCOUNTER — Ambulatory Visit
Admission: EM | Admit: 2021-05-29 | Discharge: 2021-05-29 | Disposition: A | Discharge status: Home / Self Care | Payer: 59

## 2021-05-29 ENCOUNTER — Emergency Department (HOSPITAL_BASED_OUTPATIENT_CLINIC_OR_DEPARTMENT_OTHER)
Admit: 2021-05-29 | Discharge: 2021-05-29 | Disposition: A | Discharge status: Home / Self Care | Payer: 59 | Attending: Emergency Medicine | Admitting: Emergency Medicine

## 2021-05-29 ENCOUNTER — Encounter (HOSPITAL_BASED_OUTPATIENT_CLINIC_OR_DEPARTMENT_OTHER): Payer: Self-pay | Admitting: Emergency Medicine

## 2021-05-29 DIAGNOSIS — M79604 Pain in right leg: Secondary | ICD-10-CM

## 2021-05-29 DIAGNOSIS — M25561 Pain in right knee: Secondary | ICD-10-CM | POA: Insufficient documentation

## 2021-05-29 DIAGNOSIS — M25551 Pain in right hip: Secondary | ICD-10-CM | POA: Diagnosis not present

## 2021-05-29 DIAGNOSIS — M7989 Other specified soft tissue disorders: Secondary | ICD-10-CM

## 2021-05-29 NOTE — Discharge Instructions (Signed)
Go to the [facility] ER for an ultrasound of your right leg to evaluate for DVT (blood clot in your leg). ?

## 2021-05-29 NOTE — ED Notes (Signed)
Patient sent home by vascular because they thought it was an OP procedure ?

## 2021-05-29 NOTE — ED Notes (Signed)
Called to give report for pt. To [facility] ED / spoke with Santiago Glad RN who then asked to speak with Mickel Baas PA about the Pt.  ? ? ?RN Rosana Hoes called back and spoke with Glade Nurse RN in charge due to inability of getting back with RN I spoke with earlier Santiago Glad.   ?
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
## 2021-05-29 NOTE — ED Triage Notes (Signed)
Pain in back of right leg x 2 weeks - especially behind right knee. Is a truck driver and sits a lot. Denies leg swelling. Concerned for blood clot. Denies injury to area ?

## 2021-05-29 NOTE — ED Provider Notes (Signed)
Patient here today for evaluation of right lower leg pain that has been present and worsening for 2 weeks. 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 Recommended further evaluation in the ED for doppler studies. Patient expressed understanding. ?  ?Francene Finders, PA-C ?05/29/21 1248 ? ?

## 2021-05-29 NOTE — Discharge Instructions (Signed)
?  Please report to [place] ? ?[place]  ?[place]  ? ?You will need to check in as a patient in the ED for further evaluation.  ? ? ?

## 2021-05-29 NOTE — Progress Notes (Signed)
VASCULAR LAB ? ? ?Right lower extremity venous duplex has been performed. ? ?See CV proc for preliminary results. ? ? ?Octavia Velador, RVT ?05/29/2021, 3:32 PM.vl ?

## 2021-05-29 NOTE — ED Notes (Signed)
ED Provider at bedside. 

## 2021-05-29 NOTE — ED Triage Notes (Signed)
Pt arrives pov, steady gait with c/o right calf pain x 2 weeks. 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 Denies leg swelling, denies injury ?

## 2021-05-29 NOTE — ED Provider Notes (Signed)
?[facility] EMERGENCY DEPARTMENT ?Provider Note ? ? ?CSN: [id number] ?Arrival date & time: 05/29/21  1248 ? ?  ? ?History ? ?Chief Complaint  ?Patient presents with  ? Leg Pain  ? ? ?Darren Mccoy is a 51 y.o. male. ? ?51 year old male presents with complaint of right posterior knee to calf pain x 2 weeks progressively worsening without injury.  Patient is a long-distance Administrator, and is concerned for DVT.  Patient went to urgent care today and was sent to the ED for ultrasound to evaluate for DVT.  No prior history of DVT.  Denies chest pain or shortness of breath.  No other complaints or concerns today.  Patient is otherwise healthy.  Is a non-smoker. ? ? ?  ? ?Home Medications ?Prior to Admission medications   ?Medication Sig Start Date End Date Taking? Authorizing Provider  ?benzonatate (TESSALON) 100 MG capsule Take 1 capsule (100 mg total) by mouth every 8 (eight) hours. 06/17/16   Deno Etienne, DO  ?COVID-19 mRNA bivalent vaccine, Pfizer, (PFIZER COVID-19 VAC BIVALENT) injection Inject into the muscle. 03/30/21   Carlyle Basques, MD  ?typhoid (VIVOTIF) DR capsule Take 1 capsule by mouth every other day. X 4 doses. 09/21/15   Joretta Bachelor, PA  ?   ? ?Allergies    ?Patient has no known allergies.   ? ?Review of Systems   ?Review of Systems ?Negative except as per HPI ?Physical Exam ?Updated Vital Signs ?BP 135/90   Pulse 60   Temp 97.9 ?F (36.6 ?C) (Oral)   Resp 17   Ht '5\' 10"'$  (1.778 m)   Wt 72.6 kg   SpO2 98%   BMI 22.96 kg/m?  ?Physical Exam ?Vitals and nursing note reviewed.  ?Constitutional:   ?   General: He is not in acute distress. ?   Appearance: He is well-developed. He is not diaphoretic.  ?HENT:  ?   Head: Normocephalic and atraumatic.  ?Cardiovascular:  ?   Pulses: Normal pulses.  ?Pulmonary:  ?   Effort: Pulmonary effort is normal.  ?Musculoskeletal:     ?   General: Tenderness present. No swelling or deformity.  ?   Right lower leg: No edema.  ?   Left lower leg: No edema.  ?    Comments: Mild right calf tenderness without swelling, edema, palpable cord or erythema.  DP pulse present, sensation intact.  ?Skin: ?   General: Skin is warm and dry.  ?   Findings: No erythema or rash.  ?Neurological:  ?   Mental Status: He is alert and oriented to person, place, and time.  ?   Sensory: No sensory deficit.  ?   Motor: No weakness.  ?   Gait: Gait normal.  ?Psychiatric:     ?   Behavior: Behavior normal.  ? ? ?ED Results / Procedures / Treatments   ?Labs ?(all labs ordered are listed, but only abnormal results are displayed) ?Labs Reviewed - No data to display ? ?EKG ?None ? ?Radiology ?No results found. ? ?Procedures ?Procedures  ? ? ?Medications Ordered in ED ?Medications - No data to display ? ?ED Course/ Medical Decision Making/ A&P ?  ?                        ?Medical Decision Making ? ?51 year old male with complaint of pain from right popliteal area down right calf x2 weeks.  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  On exam, does have mild calf tenderness without palpable cord, erythema, swelling or edema.  Consider muscle strain versus DVT versus Baker's cyst.  Unfortunately, ultrasound is not available at this facility at this time, patient prefers ultrasound completed today, will transfer to [facility], ED for further work-up.  Discussed with Dr. Doren Custard, ER attending who accepts patient in transfer. ? ? ? ? ? ? ? ?Final Clinical Impression(s) / ED Diagnoses ?Final diagnoses:  ?Right leg pain  ? ? ?Rx / DC Orders ?ED Discharge Orders   ? ? None  ? ?  ? ? ?  ?Tacy Learn, PA-C ?05/29/21 1517 ? ?  ?Wyvonnia Dusky, MD ?05/29/21 1523 ? ?

## 2021-08-28 ENCOUNTER — Ambulatory Visit: Payer: Self-pay

## 2022-08-28 ENCOUNTER — Ambulatory Visit
Admission: EM | Admit: 2022-08-28 | Discharge: 2022-08-28 | Disposition: A | Discharge status: Home / Self Care | Payer: BLUE CROSS/BLUE SHIELD | Attending: Family Medicine | Admitting: Family Medicine

## 2022-08-28 DIAGNOSIS — S61211A Laceration without foreign body of left index finger without damage to nail, initial encounter: Secondary | ICD-10-CM

## 2022-08-28 DIAGNOSIS — Z23 Encounter for immunization: Secondary | ICD-10-CM | POA: Diagnosis not present

## 2022-08-28 MED ORDER — TETANUS-DIPHTH-ACELL PERTUSSIS 5-2.5-18.5 LF-MCG/0.5 IM SUSY
0.5000 mL | PREFILLED_SYRINGE | Freq: Once | INTRAMUSCULAR | Status: AC
  Administered 2022-08-28: 0.5 mL via INTRAMUSCULAR

## 2022-08-28 NOTE — ED Triage Notes (Signed)
Pt reports he cut his left index finger on a kitchen knife x 1 day. Pt states he is still bleeding  Last Tetanus 09/21/2015

## 2022-08-28 NOTE — Discharge Instructions (Addendum)
Ice and elevate your finger tonight.  We have not sutured your wound to close it, since it is over 18 hours since you sustained this injury.  You can remove the pressure bandage that we put on today in the morning.  Start washing it with soapy water twice daily then and put triple antibiotic on it which she can get over-the-counter.  Keep a clean dry dressing on it  You have been given a Tdap vaccination to boost your tetanus immunity

## 2022-08-28 NOTE — ED Provider Notes (Signed)
[facility]    CSN: [id number] Arrival date & time: 08/28/22  1315      History   Chief Complaint No chief complaint on file.   HPI Darren Mccoy is a 52 y.o. male.   HPI Here for a cut on his left index finger that he sustained 24 hours ago at about 230  PM  He was in his kitchen opening a plastic bag and he used a knife to do so.  When he did so he cut across the left finger pad.  It bled overnight.  He is taking care now that he is left work.  Last tetanus was 2017.    Past Medical History:  Diagnosis Date   Acute angina ([facility])    Asthma    Chest pain    Evaluated  in Pulmonary clinic/ [facility]/ [place] / 02/23/2011      Patient Active Problem List   Diagnosis Date Noted   Lipoma of left upper extremity 11/16/2016   Acute pericarditis 05/20/2015   Pharyngitis due to group A beta hemolytic Streptococci 05/19/2015   BPH (benign prostatic hyperplasia) 05/04/2014   Chest pain 02/23/2011    History reviewed. No pertinent surgical history.     Home Medications    Prior to Admission medications   Medication Sig Start Date End Date Taking? Authorizing Provider  COVID-19 mRNA bivalent vaccine, Pfizer, (PFIZER COVID-19 VAC BIVALENT) injection Inject into the muscle. 03/30/21   Judyann Munson, MD  typhoid (VIVOTIF) DR capsule Take 1 capsule by mouth every other day. X 4 doses. 09/21/15   Garnetta Buddy, PA    Family History History reviewed. No pertinent family history.  Social History Social History   Tobacco Use   Smoking status: Never   Smokeless tobacco: Never  Substance Use Topics   Alcohol use: No   Drug use: No     Allergies   Patient has no known allergies.   Review of Systems Review of Systems   Physical Exam Triage Vital Signs ED Triage Vitals  Enc Vitals Group     BP 08/28/22 1406 121/85     Pulse Rate 08/28/22 1406 66     Resp 08/28/22 1406 18     Temp 08/28/22 1406 98 F (36.7 C)     Temp Source 08/28/22  1406 Oral     SpO2 08/28/22 1406 99 %     Weight --      Height --      Head Circumference --      Peak Flow --      Pain Score 08/28/22 1407 5     Pain Loc --      Pain Edu? --      Excl. in GC? --    No data found.  Updated Vital Signs BP 121/85 (BP Location: Left Arm)   Pulse 66   Temp 98 F (36.7 C) (Oral)   Resp 18   SpO2 99%   Visual Acuity Right Eye Distance:   Left Eye Distance:   Bilateral Distance:    Right Eye Near:   Left Eye Near:    Bilateral Near:     Physical Exam Vitals reviewed.  Constitutional:      General: He is not in acute distress.    Appearance: He is not ill-appearing, toxic-appearing or diaphoretic.  Skin:    Coloration: Skin is not pale.     Comments: On his left index finger pad there is a laceration that is  now not bleeding.  It is about 1.5 cm in length.  There is no foreign body in the wound.  Staff did clean the wound and finger prior to my examining him.  Neurological:     General: No focal deficit present.     Mental Status: He is alert and oriented to person, place, and time.  Psychiatric:        Behavior: Behavior normal.      UC Treatments / Results  Labs (all labs ordered are listed, but only abnormal results are displayed) Labs Reviewed - No data to display  EKG   Radiology No results found.  Procedures Procedures (including critical care time)  Medications Ordered in UC Medications  Tdap (BOOSTRIX) injection 0.5 mL (has no administration in time range)    Initial Impression / Assessment and Plan / UC Course  I have reviewed the triage vital signs and the nursing notes.  Pertinent labs & imaging results that were available during my care of the patient were reviewed by me and considered in my medical decision making (see chart for details).        Since this is at the 24-hour mark or perhaps a little bit longer, I do not think that suturing this laceration is recommended.  Pressure bandages applied  today and wound care as described.  Ice and elevation tonight and tomorrow  Tdap is given to boost his tetanus immunity Final Clinical Impressions(s) / UC Diagnoses   Final diagnoses:  None     Discharge Instructions      Ice and elevate your finger tonight.  We have not sutured your wound to close it, since it is over 18 hours since you sustained this injury.  You can remove the pressure bandage that we put on today in the morning.  Start washing it with soapy water twice daily then and put triple antibiotic on it which she can get over-the-counter.  Keep a clean dry dressing on it  You have been given a Tdap vaccination to boost your tetanus immunity      ED Prescriptions   None    PDMP not reviewed this encounter.   Zenia Resides, MD 08/28/22 [phone]

## 2022-08-30 ENCOUNTER — Encounter: Payer: Self-pay | Admitting: Gastroenterology

## 2022-09-06 ENCOUNTER — Ambulatory Visit: Payer: BLUE CROSS/BLUE SHIELD

## 2022-09-06 VITALS — Ht 70.0 in | Wt 167.0 lb

## 2022-09-06 DIAGNOSIS — Z1211 Encounter for screening for malignant neoplasm of colon: Secondary | ICD-10-CM

## 2022-09-06 MED ORDER — PEG 3350-KCL-NA BICARB-NACL 420 G PO SOLR: 4000.0000 mL | Freq: Once | ORAL | 0 refills | Status: AC

## 2022-09-06 NOTE — Progress Notes (Signed)
No egg or soy allergy known to patient  No issues known to pt with past sedation with any surgeries or procedures Patient denies ever being told they had issues or difficulty with intubation  No FH of Malignant Hyperthermia Pt is not on diet pills Pt is not on  home 02  Pt is not on blood thinners  Pt denies issues with constipation  No A fib or A flutter Have any cardiac testing pending--no  LOA: independently  Prep: [place]   Patient's chart reviewed by Cathlyn Parsons CNRA prior to previsit and patient appropriate for the [facility].  Previsit completed and red dot placed by patient's name on their procedure day (on provider's schedule).     PV competed with patient. Prep instructions sent via mychart and home address.

## 2022-09-27 ENCOUNTER — Encounter: Payer: BLUE CROSS/BLUE SHIELD | Admitting: Gastroenterology

## 2022-11-10 ENCOUNTER — Ambulatory Visit
Admission: EM | Admit: 2022-11-10 | Discharge: 2022-11-10 | Disposition: A | Discharge status: Home / Self Care | Payer: BLUE CROSS/BLUE SHIELD

## 2022-11-10 DIAGNOSIS — J029 Acute pharyngitis, unspecified: Secondary | ICD-10-CM

## 2022-11-10 LAB — POCT RAPID STREP A (OFFICE): Rapid Strep A Screen: NEGATIVE

## 2022-11-10 MED ORDER — AMOXICILLIN 500 MG PO CAPS: 500.0000 mg | ORAL_CAPSULE | Freq: Three times a day (TID) | ORAL | 0 refills | Status: AC

## 2022-11-10 NOTE — ED Triage Notes (Signed)
"  I have a sore throat that started last week, I have had a Fever of unknown degree". "I am now noticing right lower back pain as well". Nausea "but no vomiting. No injuries known. No new/unexplained rash. No cough. No runny nose. No @ home Testing. No dysuria. No urinary problems. Stools "normal".

## 2022-11-16 NOTE — ED Provider Notes (Signed)
[facility]    CSN: [id number] Arrival date & time: 11/10/22  0951      History   Chief Complaint Chief Complaint  Patient presents with   Pain    HPI Darren Mccoy is a 52 y.o. male.   Patient here today for evaluation of sore throat that started last week.  He reports he has had a fever but is unknown how high it has been.  He has also had some right lower back pain.  He reports some nausea but no vomiting.  He denies any injuries.  He has not had cough.  He denies any runny nose.  He has had normal bowel movements and denies any dysuria or urinary problems.  The history is provided by the patient.    Past Medical History:  Diagnosis Date   Acute angina (HCC)    Asthma    Chest pain    Evaluated  in Pulmonary clinic/ [facility]/ [place] / 02/23/2011      Patient Active Problem List   Diagnosis Date Noted   Lipoma of left upper extremity 11/16/2016   Acute pericarditis 05/20/2015   Pharyngitis due to group A beta hemolytic Streptococci 05/19/2015   BPH (benign prostatic hyperplasia) 05/04/2014   Chest pain 02/23/2011    History reviewed. No pertinent surgical history.     Home Medications    Prior to Admission medications   Medication Sig Start Date End Date Taking? Authorizing Provider  acetaminophen (TYLENOL) 325 MG suppository Place 325 mg rectally every 4 (four) hours as needed.   Yes [provider]  amoxicillin (AMOXIL) 500 MG capsule Take 1 capsule (500 mg total) by mouth 3 (three) times daily. 11/10/22  Yes Tomi Bamberger, PA-C  Cholecalciferol (VITAMIN D3) 1.25 MG (50000 UT) CAPS Take 50,000 Units by mouth once a week. 08/23/22   [provider]  COVID-19 mRNA bivalent vaccine, Pfizer, (PFIZER COVID-19 VAC BIVALENT) injection Inject into the muscle. Patient not taking: Reported on 09/06/2022 03/30/21   Judyann Munson, MD  [facility] 0.4 MG CAPS capsule Take 0.4 mg by mouth daily as needed.    [provider]   typhoid (VIVOTIF) DR capsule Take 1 capsule by mouth every other day. X 4 doses. Patient not taking: Reported on 09/06/2022 09/21/15   Garnetta Buddy, PA    Family History Family History  Problem Relation Age of Onset   Colon cancer Neg Hx    Rectal cancer Neg Hx    Stomach cancer Neg Hx    Esophageal cancer Neg Hx     Social History Social History   Tobacco Use   Smoking status: Never   Smokeless tobacco: Never  Vaping Use   Vaping status: Never Used  Substance Use Topics   Alcohol use: No   Drug use: Never     Allergies   Patient has no known allergies.   Review of Systems Review of Systems  Constitutional:  Positive for fever.  HENT:  Positive for sore throat. Negative for congestion and ear pain.   Eyes:  Negative for discharge and redness.  Respiratory:  Negative for cough and shortness of breath.   Gastrointestinal:  Positive for nausea. Negative for abdominal pain, diarrhea and vomiting.     Physical Exam Triage Vital Signs ED Triage Vitals  Encounter Vitals Group     BP 11/10/22 1026 121/80     Systolic BP Percentile --      Diastolic BP Percentile --  Pulse Rate 11/10/22 1026 68     Resp 11/10/22 1026 16     Temp 11/10/22 1026 98.3 F (36.8 C)     Temp Source 11/10/22 1026 Oral     SpO2 11/10/22 1026 97 %     Weight 11/10/22 1025 167 lb (75.8 kg)     Height 11/10/22 1025 5\' 10"  (1.778 m)     Head Circumference --      Peak Flow --      Pain Score 11/10/22 1022 0     Pain Loc --      Pain Education --      Exclude from Growth Chart --    No data found.  Updated Vital Signs BP 121/80 (BP Location: Left Arm)   Pulse 68   Temp 98.3 F (36.8 C) (Oral)   Resp 16   Ht 5\' 10"  (1.778 m)   Wt 167 lb (75.8 kg)   SpO2 97%   BMI 23.96 kg/m    Physical Exam Vitals and nursing note reviewed.  Constitutional:      General: He is not in acute distress.    Appearance: Normal appearance. He is not ill-appearing.  HENT:     Head:  Normocephalic and atraumatic.     Nose: Nose normal. No congestion.     Mouth/Throat:     Mouth: Mucous membranes are moist.     Pharynx: Oropharynx is clear. Posterior oropharyngeal erythema present. No oropharyngeal exudate.  Eyes:     Conjunctiva/sclera: Conjunctivae normal.  Cardiovascular:     Rate and Rhythm: Normal rate and regular rhythm.     Heart sounds: Normal heart sounds. No murmur heard. Pulmonary:     Effort: Pulmonary effort is normal. No respiratory distress.     Breath sounds: Normal breath sounds. No wheezing, rhonchi or rales.  Skin:    General: Skin is warm and dry.  Neurological:     Mental Status: He is alert.  Psychiatric:        Mood and Affect: Mood normal.        Thought Content: Thought content normal.      UC Treatments / Results  Labs (all labs ordered are listed, but only abnormal results are displayed) Labs Reviewed  POCT RAPID STREP A (OFFICE)    EKG   Radiology No results found.  Procedures Procedures (including critical care time)  Medications Ordered in UC Medications - No data to display  Initial Impression / Assessment and Plan / UC Course  I have reviewed the triage vital signs and the nursing notes.  Pertinent labs & imaging results that were available during my care of the patient were reviewed by me and considered in my medical decision making (see chart for details).    Strep screening negative.  Given appearance of throat will treat with amoxicillin.  Recommended monitoring and follow-up if symptoms fail to improve or worsen.  Final Clinical Impressions(s) / UC Diagnoses   Final diagnoses:  Acute pharyngitis, unspecified etiology   Discharge Instructions   None    ED Prescriptions     Medication Sig Dispense Auth. Provider   amoxicillin (AMOXIL) 500 MG capsule Take 1 capsule (500 mg total) by mouth 3 (three) times daily. 21 capsule Tomi Bamberger, PA-C      PDMP not reviewed this encounter.   Tomi Bamberger, PA-C 11/16/22 2028

## 2022-12-14 ENCOUNTER — Encounter: Payer: BLUE CROSS/BLUE SHIELD | Admitting: Family

## 2022-12-14 NOTE — Progress Notes (Signed)
Erroneous encounter-disregard

## 2023-11-12 ENCOUNTER — Emergency Department (HOSPITAL_COMMUNITY)
Admission: EM | Admit: 2023-11-12 | Discharge: 2023-11-13 | Disposition: A | Discharge status: Home / Self Care | Attending: Emergency Medicine | Admitting: Emergency Medicine

## 2023-11-12 DIAGNOSIS — R066 Hiccough: Secondary | ICD-10-CM | POA: Diagnosis present

## 2023-11-12 DIAGNOSIS — J45909 Unspecified asthma, uncomplicated: Secondary | ICD-10-CM | POA: Insufficient documentation

## 2023-11-13 ENCOUNTER — Encounter (HOSPITAL_COMMUNITY): Payer: Self-pay

## 2023-11-13 NOTE — ED Triage Notes (Signed)
 Pt. Arrives for severe hiccups all day. States that he has been unable to get rid of them. They became more frequent at around 6pm. Denies chest pain and SOB. Reports recently starting a new medication for headache given to him by urgent care. State that he thinks that is the cause.

## 2023-11-13 NOTE — ED Provider Notes (Signed)
 [place] [facility] AT [facility] Provider Note  CSN: [id number] Arrival date & time: 11/12/23 2331  Chief Complaint(s) Hiccups  HPI Darren Mccoy is a 53 y.o. male here for hiccups that began around 6 PM yesterday afternoon.  Patient was placed on sumatriptan for migraine headaches at urgent care yesterday after but given a migraine cocktail.  Hiccups do not resolve prompting his visit to the emergency department.  However 20 minutes after arrival,  they resolved.  Patient has been waiting for several hours to be seen without recurrence.  No associated chest pain or shortness of breath.  No abdominal pain.  No other physical complaints  The history is provided by the patient.    Past Medical History Past Medical History:  Diagnosis Date   Acute angina (HCC)    Asthma    Chest pain    Evaluated  in Pulmonary clinic/ [facility]/ [place] / 02/23/2011     Patient Active Problem List   Diagnosis Date Noted   Lipoma of left upper extremity 11/16/2016   Acute pericarditis 05/20/2015   Pharyngitis due to group A beta hemolytic Streptococci 05/19/2015   BPH (benign prostatic hyperplasia) 05/04/2014   Chest pain 02/23/2011   Home Medication(s) Prior to Admission medications   Medication Sig Start Date End Date Taking? Authorizing Provider  acetaminophen  (TYLENOL ) 325 MG suppository Place 325 mg rectally every 4 (four) hours as needed.    [provider]  amoxicillin  (AMOXIL ) 500 MG capsule Take 1 capsule (500 mg total) by mouth 3 (three) times daily. 11/10/22   Billy Asberry FALCON, PA-C  Cholecalciferol (VITAMIN D3) 1.25 MG (50000 UT) CAPS Take 50,000 Units by mouth once a week. 08/23/22   [provider]  COVID-19 mRNA bivalent vaccine, Pfizer, (PFIZER COVID-19 VAC BIVALENT) injection Inject into the muscle. Patient not taking: Reported on 09/06/2022 03/30/21   Luiz Channel, MD  FLOMAX  0.4 MG CAPS capsule Take 0.4 mg by mouth daily as needed.     [provider]  typhoid (VIVOTIF) DR capsule Take 1 capsule by mouth every other day. X 4 doses. Patient not taking: Reported on 09/06/2022 09/21/15   Isadora Krabbe D, PA                                                                                                                                    Allergies Patient has no known allergies.  Review of Systems Review of Systems As noted in HPI  Physical Exam Vital Signs  I have reviewed the triage vital signs BP (!) 146/97 (BP Location: Left Arm)   Pulse (!) 57   Temp 97.7 F (36.5 C) (Oral)   Resp 18   SpO2 100%   Physical Exam Vitals reviewed.  Constitutional:      General: He is not in acute distress.    Appearance: He is well-developed. He is not diaphoretic.  HENT:  Head: Normocephalic and atraumatic.     Right Ear: External ear normal.     Left Ear: External ear normal.     Nose: Nose normal.     Mouth/Throat:     Mouth: Mucous membranes are moist.  Eyes:     General: No scleral icterus.    Conjunctiva/sclera: Conjunctivae normal.  Neck:     Trachea: Phonation normal.  Cardiovascular:     Rate and Rhythm: Normal rate and regular rhythm.  Pulmonary:     Effort: Pulmonary effort is normal. No respiratory distress.     Breath sounds: No stridor.  Abdominal:     General: There is no distension.  Musculoskeletal:        General: Normal range of motion.     Cervical back: Normal range of motion.  Neurological:     Mental Status: He is alert and oriented to person, place, and time.  Psychiatric:        Behavior: Behavior normal.     ED Results and Treatments Labs (all labs ordered are listed, but only abnormal results are displayed) Labs Reviewed - No data to display                                                                                                                       EKG  EKG Interpretation Date/Time:  Monday November 12 2023 23:54:05 EDT Ventricular Rate:  61 PR  Interval:  132 QRS Duration:  96 QT Interval:  392 QTC Calculation: 395 R Axis:   33  Text Interpretation: Sinus rhythm Abnormal R-wave progression, early transition Abnormal T, consider ischemia, diffuse leads Confirmed by Trine Likes [id number]) on 11/13/2023 3:23:57 AM       Radiology No results found.  Medications Ordered in ED Medications - No data to display Procedures Procedures  (including critical care time) Medical Decision Making / ED Course   Medical Decision Making   Resolved hiccups. Supportive management recommended.     Final Clinical Impression(s) / ED Diagnoses Final diagnoses:  Hiccups   The patient appears reasonably screened and/or stabilized for discharge and I doubt any other medical condition or other [facility] requiring further screening, evaluation, or treatment in the ED at this time. I have discussed the findings, Dx and Tx plan with the patient/family who expressed understanding and agree(s) with the plan. Discharge instructions discussed at length. The patient/family was given strict return precautions who verbalized understanding of the instructions. No further questions at time of discharge.  Disposition: Discharge  Condition: Good  ED Discharge Orders     None          This chart was dictated using voice recognition software.  Despite best efforts to proofread,  errors can occur which can change the documentation meaning.    Trine Likes Moder, MD 11/13/23 [phone]
